# Patient Record
Sex: Female | Born: 1940
Health system: Southern US, Community
[De-identification: ages and names within clinical notes are randomized; demographics above are authoritative.]

## PROBLEM LIST (undated history)

## (undated) DIAGNOSIS — K5790 Diverticulosis of intestine, part unspecified, without perforation or abscess without bleeding: Secondary | ICD-10-CM

## (undated) DIAGNOSIS — M5030 Other cervical disc degeneration, unspecified cervical region: Secondary | ICD-10-CM

## (undated) DIAGNOSIS — M810 Age-related osteoporosis without current pathological fracture: Secondary | ICD-10-CM

## (undated) DIAGNOSIS — K635 Polyp of colon: Secondary | ICD-10-CM

## (undated) DIAGNOSIS — G4700 Insomnia, unspecified: Secondary | ICD-10-CM

## (undated) DIAGNOSIS — E785 Hyperlipidemia, unspecified: Secondary | ICD-10-CM

## (undated) DIAGNOSIS — R9430 Abnormal result of cardiovascular function study, unspecified: Secondary | ICD-10-CM

## (undated) DIAGNOSIS — J189 Pneumonia, unspecified organism: Secondary | ICD-10-CM

## (undated) HISTORY — DX: Hyperlipidemia, unspecified: E78.5

## (undated) HISTORY — DX: Abnormal result of cardiovascular function study, unspecified: R94.30

## (undated) HISTORY — PX: ABDOMINAL HYSTERECTOMY: SHX81

## (undated) HISTORY — DX: Diverticulosis of intestine, part unspecified, without perforation or abscess without bleeding: K57.90

## (undated) HISTORY — DX: Other cervical disc degeneration, unspecified cervical region: M50.30

## (undated) HISTORY — DX: Age-related osteoporosis without current pathological fracture: M81.0

## (undated) HISTORY — DX: Polyp of colon: K63.5

## (undated) HISTORY — DX: Pneumonia, unspecified organism: J18.9

## (undated) HISTORY — PX: FOOT SURGERY: SHX648

## (undated) HISTORY — DX: Insomnia, unspecified: G47.00

---

## 1969-03-24 HISTORY — PX: ABDOMINAL HYSTERECTOMY: SHX81

## 2003-08-19 ENCOUNTER — Ambulatory Visit (HOSPITAL_COMMUNITY): Admission: RE | Admit: 2003-08-19 | Discharge: 2003-08-19 | Payer: Self-pay | Admitting: Gastroenterology

## 2011-08-02 DIAGNOSIS — Z1231 Encounter for screening mammogram for malignant neoplasm of breast: Secondary | ICD-10-CM | POA: Diagnosis not present

## 2011-12-28 DIAGNOSIS — H35369 Drusen (degenerative) of macula, unspecified eye: Secondary | ICD-10-CM | POA: Diagnosis not present

## 2012-03-27 DIAGNOSIS — G479 Sleep disorder, unspecified: Secondary | ICD-10-CM | POA: Diagnosis not present

## 2012-03-27 DIAGNOSIS — E663 Overweight: Secondary | ICD-10-CM | POA: Diagnosis not present

## 2012-03-27 DIAGNOSIS — Z79899 Other long term (current) drug therapy: Secondary | ICD-10-CM | POA: Diagnosis not present

## 2012-03-27 DIAGNOSIS — Z1331 Encounter for screening for depression: Secondary | ICD-10-CM | POA: Diagnosis not present

## 2012-03-27 DIAGNOSIS — E7800 Pure hypercholesterolemia, unspecified: Secondary | ICD-10-CM | POA: Diagnosis not present

## 2012-03-27 DIAGNOSIS — Z0000 Encounter for general adult medical examination without abnormal findings: Secondary | ICD-10-CM | POA: Diagnosis not present

## 2012-03-27 DIAGNOSIS — M810 Age-related osteoporosis without current pathological fracture: Secondary | ICD-10-CM | POA: Diagnosis not present

## 2012-03-27 DIAGNOSIS — Z131 Encounter for screening for diabetes mellitus: Secondary | ICD-10-CM | POA: Diagnosis not present

## 2012-04-09 DIAGNOSIS — M810 Age-related osteoporosis without current pathological fracture: Secondary | ICD-10-CM | POA: Diagnosis not present

## 2012-04-09 DIAGNOSIS — Z8262 Family history of osteoporosis: Secondary | ICD-10-CM | POA: Diagnosis not present

## 2012-05-06 DIAGNOSIS — Z23 Encounter for immunization: Secondary | ICD-10-CM | POA: Diagnosis not present

## 2012-05-28 DIAGNOSIS — M79609 Pain in unspecified limb: Secondary | ICD-10-CM | POA: Diagnosis not present

## 2012-05-28 DIAGNOSIS — M7730 Calcaneal spur, unspecified foot: Secondary | ICD-10-CM | POA: Diagnosis not present

## 2012-05-28 DIAGNOSIS — M7750 Other enthesopathy of unspecified foot: Secondary | ICD-10-CM | POA: Diagnosis not present

## 2013-01-01 DIAGNOSIS — H5200 Hypermetropia, unspecified eye: Secondary | ICD-10-CM | POA: Diagnosis not present

## 2013-01-01 DIAGNOSIS — H52229 Regular astigmatism, unspecified eye: Secondary | ICD-10-CM | POA: Diagnosis not present

## 2013-01-01 DIAGNOSIS — H2510 Age-related nuclear cataract, unspecified eye: Secondary | ICD-10-CM | POA: Diagnosis not present

## 2013-01-01 DIAGNOSIS — H35449 Age-related reticular degeneration of retina, unspecified eye: Secondary | ICD-10-CM | POA: Diagnosis not present

## 2013-05-21 DIAGNOSIS — C44319 Basal cell carcinoma of skin of other parts of face: Secondary | ICD-10-CM | POA: Diagnosis not present

## 2013-05-21 DIAGNOSIS — L821 Other seborrheic keratosis: Secondary | ICD-10-CM | POA: Diagnosis not present

## 2013-05-21 DIAGNOSIS — D485 Neoplasm of uncertain behavior of skin: Secondary | ICD-10-CM | POA: Diagnosis not present

## 2013-06-10 DIAGNOSIS — Z0000 Encounter for general adult medical examination without abnormal findings: Secondary | ICD-10-CM | POA: Diagnosis not present

## 2013-06-10 DIAGNOSIS — E663 Overweight: Secondary | ICD-10-CM | POA: Diagnosis not present

## 2013-06-10 DIAGNOSIS — M810 Age-related osteoporosis without current pathological fracture: Secondary | ICD-10-CM | POA: Diagnosis not present

## 2013-06-10 DIAGNOSIS — G479 Sleep disorder, unspecified: Secondary | ICD-10-CM | POA: Diagnosis not present

## 2013-06-10 DIAGNOSIS — M79609 Pain in unspecified limb: Secondary | ICD-10-CM | POA: Diagnosis not present

## 2013-06-10 DIAGNOSIS — R0609 Other forms of dyspnea: Secondary | ICD-10-CM | POA: Diagnosis not present

## 2013-06-10 DIAGNOSIS — Z23 Encounter for immunization: Secondary | ICD-10-CM | POA: Diagnosis not present

## 2013-06-10 DIAGNOSIS — E7800 Pure hypercholesterolemia, unspecified: Secondary | ICD-10-CM | POA: Diagnosis not present

## 2013-06-12 ENCOUNTER — Other Ambulatory Visit: Payer: Self-pay | Admitting: Family Medicine

## 2013-06-12 DIAGNOSIS — R0609 Other forms of dyspnea: Secondary | ICD-10-CM

## 2013-06-16 ENCOUNTER — Ambulatory Visit
Admission: RE | Admit: 2013-06-16 | Discharge: 2013-06-16 | Disposition: A | Discharge status: Home / Self Care | Payer: Medicare Other | Source: Ambulatory Visit | Attending: Family Medicine | Admitting: Family Medicine

## 2013-06-16 DIAGNOSIS — R0609 Other forms of dyspnea: Secondary | ICD-10-CM

## 2013-06-16 HISTORY — PX: OTHER SURGICAL HISTORY: SHX169

## 2013-06-16 MED ORDER — IOHEXOL 350 MG/ML SOLN
125.0000 mL | Freq: Once | INTRAVENOUS | Status: AC | PRN
  Administered 2013-06-16: 125 mL via INTRAVENOUS

## 2013-06-17 DIAGNOSIS — C4401 Basal cell carcinoma of skin of lip: Secondary | ICD-10-CM | POA: Diagnosis not present

## 2013-06-20 ENCOUNTER — Ambulatory Visit (INDEPENDENT_AMBULATORY_CARE_PROVIDER_SITE_OTHER): Payer: Medicare Other | Admitting: Critical Care Medicine

## 2013-06-20 ENCOUNTER — Encounter: Payer: Self-pay | Admitting: Critical Care Medicine

## 2013-06-20 VITALS — BP 128/80 | HR 78 | Temp 98.3°F | Ht 63.5 in | Wt 164.0 lb

## 2013-06-20 DIAGNOSIS — R9389 Abnormal findings on diagnostic imaging of other specified body structures: Secondary | ICD-10-CM

## 2013-06-20 DIAGNOSIS — C4491 Basal cell carcinoma of skin, unspecified: Secondary | ICD-10-CM

## 2013-06-20 DIAGNOSIS — E785 Hyperlipidemia, unspecified: Secondary | ICD-10-CM | POA: Diagnosis not present

## 2013-06-20 DIAGNOSIS — M810 Age-related osteoporosis without current pathological fracture: Secondary | ICD-10-CM

## 2013-06-20 DIAGNOSIS — R0609 Other forms of dyspnea: Secondary | ICD-10-CM | POA: Diagnosis not present

## 2013-06-20 DIAGNOSIS — R0989 Other specified symptoms and signs involving the circulatory and respiratory systems: Secondary | ICD-10-CM

## 2013-06-20 DIAGNOSIS — R0600 Dyspnea, unspecified: Secondary | ICD-10-CM | POA: Insufficient documentation

## 2013-06-20 MED ORDER — AZITHROMYCIN 250 MG PO TABS: ORAL_TABLET | ORAL | Status: AC

## 2013-06-20 MED ORDER — AZITHROMYCIN 250 MG PO TABS: ORAL_TABLET | ORAL | Status: DC

## 2013-06-20 NOTE — Assessment & Plan Note (Signed)
Mild right middle lobe tree in bud infiltrate centered along right middle lobe airway likely representing Mycobacterium avium intracellulare infection No evidence for malignancy or significant bronchiectasis Plan Begin azithromycin 250 mg daily for 10 days Obtain full set of pulmonary function studies No indication for bronchoscopy

## 2013-06-20 NOTE — Progress Notes (Signed)
Subjective:    Patient ID: Natasha Madden, female    DOB: June 24, 1941, 72 y.o.   MRN: [id number]  HPI Comments: Pt had a physical exam one week ago and noted dyspnea for several years, unchanged. CT done of chest for PE r/o>>>neg for PE but abn in upper lobe   Shortness of Breath This is a chronic problem. The current episode started more than 1 year ago. Episode frequency: if walks incline or run fast only. The problem has been unchanged. Associated symptoms include rhinorrhea. Pertinent negatives include no abdominal pain, chest pain, claudication, coryza, ear pain, fever, headaches, hemoptysis, leg pain, leg swelling, neck pain, orthopnea, PND, rash, sore throat, sputum production, swollen glands, syncope, vomiting or wheezing. The symptoms are aggravated by exercise. Associated symptoms comments: Cough is new for past week only. Risk factors include smoking (passive smoke only ). She has tried nothing for the symptoms. There is no history of allergies, aspirin allergies, asthma, bronchiolitis, CAD, chronic lung disease, COPD, DVT, a heart failure, PE, pneumonia or a recent surgery.    Past Medical History  Diagnosis Date  . Colon polyp   . Osteoporosis   . Hyperlipidemia   . Insomnia   . Diverticulosis      Family History  Problem Relation Age of Onset  . Pneumonia Father 70  . Heart failure Mother   . Heart disease Sister   . Diabetes Sister      History   Social History  . Marital Status: Married    Spouse Name: N/A    Number of Children: N/A  . Years of Education: N/A   Occupational History  . Not on file.   Social History Main Topics  . Smoking status: Never Smoker   . Smokeless tobacco: Not on file  . Alcohol Use: Yes     Comment: social  . Drug Use: No  . Sexual Activity: Not on file   Other Topics Concern  . Not on file   Social History Narrative  . No narrative on file     No Known Allergies   No outpatient prescriptions prior to visit.   No  facility-administered medications prior to visit.      Review of Systems  Constitutional: Negative for fever and unexpected weight change.  HENT: Positive for congestion, rhinorrhea and sinus pressure. Negative for dental problem, ear pain, nosebleeds, sneezing, sore throat and trouble swallowing.   Eyes: Negative for redness and itching.  Respiratory: Positive for shortness of breath. Negative for hemoptysis, sputum production, chest tightness and wheezing.   Cardiovascular: Negative for chest pain, palpitations, orthopnea, claudication, leg swelling, syncope and PND.  Gastrointestinal: Negative for nausea, vomiting and abdominal pain.  Genitourinary: Positive for dysuria.  Musculoskeletal: Negative for joint swelling and neck pain.  Skin: Negative for rash.  Neurological: Negative for headaches.  Hematological: Does not bruise/bleed easily.  Psychiatric/Behavioral: Positive for dysphoric mood. The patient is not nervous/anxious.        Objective:   Physical Exam Filed Vitals:   06/20/13 0908  BP: 128/80  Pulse: 78  Temp: 98.3 F (36.8 C)  TempSrc: Oral  Height: 5' 3.5" (1.613 m)  Weight: 164 lb (74.39 kg)  SpO2: 97%    Gen: Pleasant, well-nourished, in no distress,  normal affect  ENT: No lesions,  mouth clear,  oropharynx clear, no postnasal drip  Neck: No JVD, no TMG, no carotid bruits  Lungs: No use of accessory muscles, no dullness to percussion, clear without rales or  rhonchi  Cardiovascular: RRR, heart sounds normal, no murmur or gallops, no peripheral edema  Abdomen: soft and NT, no HSM,  BS normal  Musculoskeletal: No deformities, no cyanosis or clubbing  Neuro: alert, non focal  Skin: Warm, no lesions or rashes  No results found. Recent CT scan of the chest was reviewed and shows right middle lobe tree in bud infiltrate and distal airway compatible with MAI       Assessment & Plan:   Abnormal CT of the chest Mild right middle lobe tree in bud  infiltrate centered along right middle lobe airway likely representing Mycobacterium avium intracellulare infection No evidence for malignancy or significant bronchiectasis Plan Begin azithromycin 250 mg daily for 10 days Obtain full set of pulmonary function studies No indication for bronchoscopy  Dyspnea Dyspnea on the basis of upper airway inflammation Plan Obtain pulmonary function studies   Updated Medication List Outpatient Encounter Prescriptions as of 06/20/2013  Medication Sig  . alendronate (FOSAMAX) 70 MG tablet Take 70 mg by mouth once a week. Take with a full glass of water on an empty stomach.  Marland Kitchen azithromycin (ZITHROMAX) 250 MG tablet Take one daily  . simvastatin (ZOCOR) 40 MG tablet Take 40 mg by mouth daily.  . [DISCONTINUED] azithromycin (ZITHROMAX) 250 MG tablet Take 250 mg by mouth daily.  . [DISCONTINUED] azithromycin (ZITHROMAX) 250 MG tablet Take one daily  . [DISCONTINUED] azithromycin (ZITHROMAX) 250 MG tablet Take one daily  . [DISCONTINUED] azithromycin (ZITHROMAX) 250 MG tablet Take one daily

## 2013-06-20 NOTE — Patient Instructions (Signed)
Take azithromycin for 10 days total.  4 more sent to pharmacy A pulmonary function study will be obtained along with a 6 min walk Return 1 month

## 2013-06-20 NOTE — Assessment & Plan Note (Signed)
Dyspnea on the basis of upper airway inflammation Plan Obtain pulmonary function studies

## 2013-07-25 ENCOUNTER — Encounter: Payer: Self-pay | Admitting: Internal Medicine

## 2013-07-25 ENCOUNTER — Encounter (INDEPENDENT_AMBULATORY_CARE_PROVIDER_SITE_OTHER): Payer: Self-pay

## 2013-07-25 ENCOUNTER — Ambulatory Visit (INDEPENDENT_AMBULATORY_CARE_PROVIDER_SITE_OTHER): Payer: Medicare Other | Admitting: Internal Medicine

## 2013-07-25 VITALS — BP 120/80 | HR 94 | Temp 98.1°F | Ht 63.5 in | Wt 165.2 lb

## 2013-07-25 DIAGNOSIS — R0989 Other specified symptoms and signs involving the circulatory and respiratory systems: Secondary | ICD-10-CM

## 2013-07-25 DIAGNOSIS — R0609 Other forms of dyspnea: Secondary | ICD-10-CM

## 2013-07-25 DIAGNOSIS — R0600 Dyspnea, unspecified: Secondary | ICD-10-CM

## 2013-07-25 DIAGNOSIS — R9389 Abnormal findings on diagnostic imaging of other specified body structures: Secondary | ICD-10-CM

## 2013-07-25 LAB — PULMONARY FUNCTION TEST
DL/VA % pred: 96 %
DL/VA: 4.56 ml/min/mmHg/L
DLCO unc % pred: 78 %
DLCO unc: 18.51 ml/min/mmHg
FEF 25-75 POST: 1.47 L/s
FEF 25-75 Pre: 1.12 L/sec
FEF2575-%Change-Post: 31 %
FEF2575-%PRED-POST: 87 %
FEF2575-%PRED-PRE: 66 %
FEV1-%Change-Post: 7 %
FEV1-%PRED-POST: 90 %
FEV1-%PRED-PRE: 83 %
FEV1-Post: 1.89 L
FEV1-Pre: 1.75 L
FEV1FVC-%Change-Post: 6 %
FEV1FVC-%Pred-Pre: 93 %
FEV6-%CHANGE-POST: 2 %
FEV6-%Pred-Post: 94 %
FEV6-%Pred-Pre: 91 %
FEV6-Post: 2.51 L
FEV6-Pre: 2.44 L
FEV6FVC-%Change-Post: 1 %
FEV6FVC-%Pred-Post: 104 %
FEV6FVC-%Pred-Pre: 102 %
FVC-%Change-Post: 1 %
FVC-%PRED-POST: 91 %
FVC-%Pred-Pre: 89 %
FVC-Post: 2.54 L
FVC-Pre: 2.5 L
PRE FEV1/FVC RATIO: 70 %
Post FEV1/FVC ratio: 74 %
Post FEV6/FVC ratio: 99 %
Pre FEV6/FVC Ratio: 98 %
RV % pred: 90 %
RV: 2.02 L
TLC % pred: 91 %
TLC: 4.55 L

## 2013-07-25 NOTE — Patient Instructions (Signed)
Best option for cough is mucinex dm up to 1200 mg every 12 hours as needed  If not responding to mucinex dm we need to see you away:  Ask Dr Joya Gaskins / Tammy NP/ Dr Melvyn Novas

## 2013-07-25 NOTE — Progress Notes (Signed)
Subjective:    Patient ID: Natasha Madden, female    DOB: 1940/12/04   MRN: [id number]  HPI Comments:  06/20/13  Eval/ Dr Joya Gaskins Pt had a physical exam one week prior to OV   and noted dyspnea for several years, unchanged. CT done of chest for PE r/o>>>neg for PE but abn in upper lobe   Shortness of Breath This is a chronic problem. The current episode started more than 1 year ago. Episode frequency: if walks incline or run fast only. The problem has been unchanged. Associated symptoms include rhinorrhea. Pertinent negatives include no abdominal pain, chest pain, claudication, coryza, ear pain, fever, headaches, hemoptysis, leg pain, leg swelling, neck pain, orthopnea, PND, rash, sore throat, sputum production, swollen glands, syncope, vomiting or wheezing. The symptoms are aggravated by exercise. Associated symptoms comments: Cough is new for past week only. Risk factors include smoking (passive smoke only ). She has tried nothing for the symptoms. There is no history of allergies, aspirin allergies, asthma, bronchiolitis, CAD, chronic lung disease, COPD, DVT, a heart failure, PE, pneumonia or a recent surgery. rec  Take azithromycin for 10 days total.  4 more sent to pharmacy A pulmonary function study will be obtained along with a 6 min walk  07/25/2013 f/u ov/Natasha Madden re:  ? MAI Chief Complaint  Patient presents with  . Follow-up    PFT and 6 MINUTE WALK done today.  Discuss results   pt improved, denies limiting sob but not aerobically active, some am cough/ congestion but no discolored mucus.  No obvious day to day or daytime variabilty or assoc   cp or chest tightness, subjective wheeze overt sinus or hb symptoms. No unusual exp hx or h/o childhood pna/ asthma or knowledge of premature birth.  Sleeping ok without nocturnal  or early am exacerbation  of respiratory  c/o's or need for noct saba. Also denies any obvious fluctuation of symptoms with weather or environmental changes or other  aggravating or alleviating factors except as outlined above   Current Medications, Allergies, Complete Past Medical History, Past Surgical History, Family History, and Social History were reviewed in [employer] record.  ROS  The following are not active complaints unless bolded sore throat, dysphagia, dental problems, itching, sneezing,  nasal congestion or excess/ purulent secretions, ear ache,   fever, chills, sweats, unintended wt loss, pleuritic or exertional cp, hemoptysis,  orthopnea pnd or leg swelling, presyncope, palpitations, heartburn, abdominal pain, anorexia, nausea, vomiting, diarrhea  or change in bowel or urinary habits, change in stools or urine, dysuria,hematuria,  rash, arthralgias, visual complaints, headache, numbness weakness or ataxia or problems with walking or coordination,  change in mood/affect or memory.             Objective:   Physical Exam  Wt Readings from Last 3 Encounters:  07/25/13 165 lb 3.2 oz (74.934 kg)  06/20/13 164 lb (74.39 kg)        Gen: Pleasant, well-nourished, in no distress,  normal affect  ENT: No lesions,  mouth clear,  oropharynx clear, no postnasal drip  Neck: No JVD, no TMG, no carotid bruits  Lungs: No use of accessory muscles, no dullness to percussion, clear without rales or rhonchi  Cardiovascular: RRR, heart sounds normal, no murmur or gallops, no peripheral edema  Abdomen: soft and NT, no HSM,  BS normal  Musculoskeletal: No deformities, no cyanosis or clubbing  Neuro: alert, non focal  Skin: Warm, no lesions or rashes  06/16/13 CT scan of the chest was reviewed and shows right middle lobe tree in bud infiltrate and distal airway compatible with MAI       Assessment & Plan:

## 2013-07-25 NOTE — Progress Notes (Signed)
PFT done today. 

## 2013-07-27 NOTE — Assessment & Plan Note (Addendum)
See 6 mw test 07/25/13 :  489 m   With no desats or difficulty - PFT's 07/25/13 FEV1  1.75 (83%) ratio 70 and nl DLCO 78% DLC0 78%  ERV 65%  Does not appear to be limited by breathing at this point and only significant abnormality is ERV which is related to body habitus and not typically a pulmonary problem  > pulmonary f/u is prn

## 2013-07-27 NOTE — Assessment & Plan Note (Addendum)
Although there are clearly abnormalities on CT scan, they should probably be considered "microscopic" since not obvious on plain cxr .     she very well could have low grade MAI but no assoc bronchiectasis and improved after only short course of zmax which is good choice for any flares of bronchitis anyway.   For now rec mucinex and f/u prn

## 2013-07-28 ENCOUNTER — Encounter: Payer: Self-pay | Admitting: Internal Medicine

## 2013-07-29 NOTE — Progress Notes (Signed)
Quick Note:  Called, spoke with pt. Informed her of results per Dr. Joya Gaskins. She verbalized understanding of this. MW had also reviewed results with her during OV after PFT. ______

## 2013-08-05 DIAGNOSIS — Z1231 Encounter for screening mammogram for malignant neoplasm of breast: Secondary | ICD-10-CM | POA: Diagnosis not present

## 2013-12-09 DIAGNOSIS — Z23 Encounter for immunization: Secondary | ICD-10-CM | POA: Diagnosis not present

## 2013-12-09 DIAGNOSIS — G479 Sleep disorder, unspecified: Secondary | ICD-10-CM | POA: Diagnosis not present

## 2013-12-09 DIAGNOSIS — M810 Age-related osteoporosis without current pathological fracture: Secondary | ICD-10-CM | POA: Diagnosis not present

## 2013-12-09 DIAGNOSIS — R0609 Other forms of dyspnea: Secondary | ICD-10-CM | POA: Diagnosis not present

## 2013-12-09 DIAGNOSIS — E7800 Pure hypercholesterolemia, unspecified: Secondary | ICD-10-CM | POA: Diagnosis not present

## 2013-12-09 DIAGNOSIS — R0989 Other specified symptoms and signs involving the circulatory and respiratory systems: Secondary | ICD-10-CM | POA: Diagnosis not present

## 2014-01-22 ENCOUNTER — Encounter: Payer: Self-pay | Admitting: Cardiology

## 2014-01-22 DIAGNOSIS — E785 Hyperlipidemia, unspecified: Secondary | ICD-10-CM | POA: Insufficient documentation

## 2014-01-26 ENCOUNTER — Encounter: Payer: Self-pay | Admitting: Cardiology

## 2014-01-26 ENCOUNTER — Ambulatory Visit (INDEPENDENT_AMBULATORY_CARE_PROVIDER_SITE_OTHER): Payer: Medicare Other | Admitting: Cardiology

## 2014-01-26 ENCOUNTER — Other Ambulatory Visit: Payer: Self-pay | Admitting: *Deleted

## 2014-01-26 VITALS — BP 131/78 | HR 87 | Ht 63.0 in | Wt 163.0 lb

## 2014-01-26 DIAGNOSIS — R0600 Dyspnea, unspecified: Secondary | ICD-10-CM

## 2014-01-26 DIAGNOSIS — R0989 Other specified symptoms and signs involving the circulatory and respiratory systems: Secondary | ICD-10-CM | POA: Diagnosis not present

## 2014-01-26 DIAGNOSIS — E785 Hyperlipidemia, unspecified: Secondary | ICD-10-CM

## 2014-01-26 DIAGNOSIS — R0609 Other forms of dyspnea: Secondary | ICD-10-CM | POA: Diagnosis not present

## 2014-01-26 DIAGNOSIS — Z136 Encounter for screening for cardiovascular disorders: Secondary | ICD-10-CM | POA: Diagnosis not present

## 2014-01-26 NOTE — Patient Instructions (Signed)
Your physician recommends that you schedule a follow-up appointment in: 3 months. You will receive a reminder letter in the mail in about 1-2 months reminding you to call and schedule your appointment. If you don't receive this letter, please contact our office. Your physician recommends that you continue on your current medications as directed. Please refer to the Current Medication list given to you today. Your physician has requested that you have an echocardiogram. Echocardiography is a painless test that uses sound waves to create images of your heart. It provides your doctor with information about the size and shape of your heart and how well your heart's chambers and valves are working. This procedure takes approximately one hour. There are no restrictions for this procedure.

## 2014-01-26 NOTE — Assessment & Plan Note (Signed)
Her lipids are being treated. No change in therapy. 

## 2014-01-26 NOTE — Assessment & Plan Note (Signed)
The major complaint is that for a long period of time she has noted that she gets shortness of breath when walking up a hill. She does not sense this when walking on flat areas. She think she may have felt this way for many years. Her lung evaluation has shown no marked pulmonary disease. Her EKG shows nonspecific ST-T wave changes. I'm not convinced that this represents ischemia at this time. Therefore I've chosen not to proceed with any exercise testing. However it is important to rule out underlying left ventricular dysfunction. Two-dimensional echo will be done. We will be in touch with her with the results. Will also plan to see her back in one followup visit in approximately 3 months to reassess her symptoms.

## 2014-01-26 NOTE — Progress Notes (Signed)
Patient ID: Natasha Madden, female   DOB: [birth year], 73 y.o.   MRN: [id number]    HPI   The patient is seen today in consultation for the evaluation of exertional shortness of breath. She actually does quite well. Her symptom comes on only when she is walking uphill. She is actually had this for at least one year. She's had a full valuation by pulmonary. This has included a 6 minute walk and pulmonary function studies. It is felt that she does not have any significant pulmonary component causing significant shortness of breath. She denies any chest pain. I take care of her husband Mr Elania Crowl.  As part of today's evaluation I have carefully reviewed the primary care note sent to me by Dr. Kathyrn Lass. I have also reviewed the pulmonary notes in the electronic medical record. I have reviewed labs that were sent.   No Known Allergies  Current Outpatient Prescriptions  Medication Sig Dispense Refill  . Multiple Vitamin (MULTIVITAMIN) tablet Take 1 tablet by mouth daily.      . simvastatin (ZOCOR) 40 MG tablet Take 40 mg by mouth daily.       No current facility-administered medications for this visit.    History   Social History  . Marital Status: Married    Spouse Name: N/A    Number of Children: N/A  . Years of Education: N/A   Occupational History  . Not on file.   Social History Main Topics  . Smoking status: Never Smoker   . Smokeless tobacco: Never Used  . Alcohol Use: Yes     Comment: social  . Drug Use: No  . Sexual Activity: Not on file   Other Topics Concern  . Not on file   Social History Narrative  . No narrative on file    Family History  Problem Relation Age of Onset  . Pneumonia Father 62  . Heart failure Mother   . Heart disease Sister   . Diabetes Sister     Past Medical History  Diagnosis Date  . Colon polyp   . Osteoporosis   . Hyperlipidemia   . Insomnia   . Diverticulosis     Past Surgical History  Procedure Laterality Date  . Skin  cancer removal  06/16/13    lower lip  . Abdominal hysterectomy  1970's    Patient Active Problem List   Diagnosis Date Noted  . Hyperlipidemia   . Osteoporosis, unspecified 06/20/2013  . Dyspnea 06/20/2013  . Abnormal CT of the chest 06/20/2013  . Basal cell carcinoma 06/20/2013    ROS   Patient denies fever, chills, headache, sweats, rash, change in vision, change in hearing, chest pain, cough, nausea or vomiting, urinary symptoms. All other systems are reviewed and are negative.  PHYSICAL EXAM   Patient is oriented to person time and place. Affect is normal. Head is atraumatic. Sclera and conjunctiva are normal. There is no jugulovenous distention. Lungs are clear. Respiratory effort is nonlabored. She is mildly overweight. Cardiac exam her vitals S1 and S2. There no clicks or significant murmurs. The abdomen is soft. There is no peripheral edema. There are no musculoskeletal deformities. There are no skin rashes.  Filed Vitals:   01/26/14 1320  BP: 131/78  Pulse: 87  Height: 5\' 3"  (1.6 m)  Weight: 163 lb (73.936 kg)   EKG is done today and reviewed by me. She has normal sinus rhythm. There are nonspecific ST-T wave abnormalities.  ASSESSMENT & PLAN

## 2014-01-28 ENCOUNTER — Other Ambulatory Visit: Payer: Self-pay

## 2014-01-28 ENCOUNTER — Other Ambulatory Visit (INDEPENDENT_AMBULATORY_CARE_PROVIDER_SITE_OTHER): Payer: Medicare Other

## 2014-01-28 DIAGNOSIS — I379 Nonrheumatic pulmonary valve disorder, unspecified: Secondary | ICD-10-CM

## 2014-01-28 DIAGNOSIS — I369 Nonrheumatic tricuspid valve disorder, unspecified: Secondary | ICD-10-CM

## 2014-01-28 DIAGNOSIS — R0602 Shortness of breath: Secondary | ICD-10-CM

## 2014-01-28 DIAGNOSIS — R0600 Dyspnea, unspecified: Secondary | ICD-10-CM

## 2014-01-28 DIAGNOSIS — I059 Rheumatic mitral valve disease, unspecified: Secondary | ICD-10-CM

## 2014-01-30 ENCOUNTER — Encounter: Payer: Self-pay | Admitting: Cardiology

## 2014-01-30 DIAGNOSIS — I340 Nonrheumatic mitral (valve) insufficiency: Secondary | ICD-10-CM | POA: Insufficient documentation

## 2014-01-30 DIAGNOSIS — R9430 Abnormal result of cardiovascular function study, unspecified: Secondary | ICD-10-CM | POA: Insufficient documentation

## 2014-02-03 ENCOUNTER — Telehealth: Payer: Self-pay | Admitting: *Deleted

## 2014-02-03 NOTE — Telephone Encounter (Signed)
Patient informed. 

## 2014-05-11 DIAGNOSIS — H35363 Drusen (degenerative) of macula, bilateral: Secondary | ICD-10-CM | POA: Diagnosis not present

## 2014-05-13 DIAGNOSIS — J40 Bronchitis, not specified as acute or chronic: Secondary | ICD-10-CM | POA: Diagnosis not present

## 2014-05-13 DIAGNOSIS — J069 Acute upper respiratory infection, unspecified: Secondary | ICD-10-CM | POA: Diagnosis not present

## 2014-05-28 DIAGNOSIS — J181 Lobar pneumonia, unspecified organism: Secondary | ICD-10-CM | POA: Diagnosis not present

## 2014-05-28 DIAGNOSIS — J069 Acute upper respiratory infection, unspecified: Secondary | ICD-10-CM | POA: Diagnosis not present

## 2014-05-28 DIAGNOSIS — J40 Bronchitis, not specified as acute or chronic: Secondary | ICD-10-CM | POA: Diagnosis not present

## 2014-06-11 DIAGNOSIS — M810 Age-related osteoporosis without current pathological fracture: Secondary | ICD-10-CM | POA: Diagnosis not present

## 2014-06-11 DIAGNOSIS — Z79899 Other long term (current) drug therapy: Secondary | ICD-10-CM | POA: Diagnosis not present

## 2014-06-11 DIAGNOSIS — E663 Overweight: Secondary | ICD-10-CM | POA: Diagnosis not present

## 2014-06-11 DIAGNOSIS — G479 Sleep disorder, unspecified: Secondary | ICD-10-CM | POA: Diagnosis not present

## 2014-06-11 DIAGNOSIS — E780 Pure hypercholesterolemia: Secondary | ICD-10-CM | POA: Diagnosis not present

## 2014-07-29 ENCOUNTER — Ambulatory Visit (INDEPENDENT_AMBULATORY_CARE_PROVIDER_SITE_OTHER): Payer: Medicare Other | Admitting: Cardiology

## 2014-07-29 ENCOUNTER — Encounter: Payer: Self-pay | Admitting: Cardiology

## 2014-07-29 VITALS — BP 122/78 | HR 73 | Ht 63.0 in | Wt 162.0 lb

## 2014-07-29 DIAGNOSIS — R0600 Dyspnea, unspecified: Secondary | ICD-10-CM | POA: Diagnosis not present

## 2014-07-29 DIAGNOSIS — I340 Nonrheumatic mitral (valve) insufficiency: Secondary | ICD-10-CM | POA: Diagnosis not present

## 2014-07-29 NOTE — Assessment & Plan Note (Signed)
She has mild mitral regurgitation by echo. Consideration could be given to a follow-up echo in 3-5 years.

## 2014-07-29 NOTE — Assessment & Plan Note (Signed)
She has good left ventricular function. She does have pulmonary disease. It appears that her shortness of breath symptoms are most related to her pulmonary disease. No further cardiac workup is needed.

## 2014-07-29 NOTE — Progress Notes (Signed)
Patient ID: Natasha Madden, female   DOB: 12/22/1940, 74 y.o.   MRN: [id number]    HPI Patient is seen to follow-up some shortness of breath. I saw her in consultation in July, 2015. She has lung disease. We did decide to proceed with a 2-D echo to assess LV function and valvular function. She has a normal ejection fraction of 60%. There is mild mitral regurgitation. Since I evaluated her in July, she has had follow-up pulmonary evaluation and a chest CT. She has some chronic changes that are being followed. It appears that her shortness of breath is a pulmonary issue.  No Known Allergies  Current Outpatient Prescriptions  Medication Sig Dispense Refill  . Multiple Vitamin (MULTIVITAMIN) tablet Take 1 tablet by mouth daily.    . simvastatin (ZOCOR) 40 MG tablet Take 40 mg by mouth daily.     No current facility-administered medications for this visit.    History   Social History  . Marital Status: Married    Spouse Name: N/A    Number of Children: N/A  . Years of Education: N/A   Occupational History  . Not on file.   Social History Main Topics  . Smoking status: Never Smoker   . Smokeless tobacco: Never Used  . Alcohol Use: Yes     Comment: social  . Drug Use: No  . Sexual Activity: Not on file   Other Topics Concern  . Not on file   Social History Narrative    Family History  Problem Relation Age of Onset  . Pneumonia Father 10  . Heart failure Mother   . Heart disease Sister   . Diabetes Sister     Past Medical History  Diagnosis Date  . Colon polyp   . Osteoporosis   . Hyperlipidemia   . Insomnia   . Diverticulosis   . Ejection fraction     Past Surgical History  Procedure Laterality Date  . Skin cancer removal  06/16/13    lower lip  . Abdominal hysterectomy  1970's    Patient Active Problem List   Diagnosis Date Noted  . Mitral regurgitation 01/30/2014  . Ejection fraction   . Hyperlipidemia   . Osteoporosis, unspecified 06/20/2013  . Dyspnea  06/20/2013  . Abnormal CT of the chest 06/20/2013  . Basal cell carcinoma 06/20/2013    ROS  Patient denies fever, chills, headache, sweats, rash, change in vision, change in hearing, chest pain, cough, nausea or vomiting, urinary symptoms. All other systems are reviewed and are negative.  PHYSICAL EXAM Patient is stable. She is oriented to person time and place. Affect is normal. Head is atraumatic. Sclera and conjunctiva are normal. There is no jugular venous distention. Lungs are clear. Respiratory effort is not labored. Cardiac exam reveals S1 and S2. The abdomen is soft. There is no peripheral edema.  Filed Vitals:   07/29/14 1041  BP: 122/78  Pulse: 73  Height: 5\' 3"  (1.6 m)  Weight: 162 lb (73.483 kg)     ASSESSMENT & PLAN

## 2014-07-29 NOTE — Patient Instructions (Signed)
   No cardiology follow up needed at this time. Please contact our office as needed. Your physician recommends that you continue on your current medications as directed. Please refer to the Current Medication list given to you today.

## 2014-08-27 DIAGNOSIS — Z09 Encounter for follow-up examination after completed treatment for conditions other than malignant neoplasm: Secondary | ICD-10-CM | POA: Diagnosis not present

## 2014-08-27 DIAGNOSIS — K5730 Diverticulosis of large intestine without perforation or abscess without bleeding: Secondary | ICD-10-CM | POA: Diagnosis not present

## 2014-08-27 DIAGNOSIS — Z86010 Personal history of colonic polyps: Secondary | ICD-10-CM | POA: Diagnosis not present

## 2014-08-31 DIAGNOSIS — Z1231 Encounter for screening mammogram for malignant neoplasm of breast: Secondary | ICD-10-CM | POA: Diagnosis not present

## 2015-02-03 DIAGNOSIS — M542 Cervicalgia: Secondary | ICD-10-CM | POA: Diagnosis not present

## 2015-04-01 ENCOUNTER — Other Ambulatory Visit: Payer: Self-pay | Admitting: Family Medicine

## 2015-04-01 ENCOUNTER — Ambulatory Visit
Admission: RE | Admit: 2015-04-01 | Discharge: 2015-04-01 | Disposition: A | Discharge status: Home / Self Care | Payer: Medicare Other | Source: Ambulatory Visit | Attending: Family Medicine | Admitting: Family Medicine

## 2015-04-01 DIAGNOSIS — M542 Cervicalgia: Secondary | ICD-10-CM | POA: Diagnosis not present

## 2015-04-01 DIAGNOSIS — M47812 Spondylosis without myelopathy or radiculopathy, cervical region: Secondary | ICD-10-CM | POA: Diagnosis not present

## 2015-04-16 DIAGNOSIS — M47811 Spondylosis without myelopathy or radiculopathy, occipito-atlanto-axial region: Secondary | ICD-10-CM | POA: Diagnosis not present

## 2015-04-16 DIAGNOSIS — M47812 Spondylosis without myelopathy or radiculopathy, cervical region: Secondary | ICD-10-CM | POA: Diagnosis not present

## 2015-04-16 DIAGNOSIS — M5032 Other cervical disc degeneration, mid-cervical region: Secondary | ICD-10-CM | POA: Diagnosis not present

## 2015-05-11 DIAGNOSIS — M5090 Cervical disc disorder, unspecified, unspecified cervical region: Secondary | ICD-10-CM | POA: Diagnosis not present

## 2015-05-11 DIAGNOSIS — M50822 Other cervical disc disorders at C5-C6 level: Secondary | ICD-10-CM | POA: Diagnosis not present

## 2015-05-19 DIAGNOSIS — M50822 Other cervical disc disorders at C5-C6 level: Secondary | ICD-10-CM | POA: Diagnosis not present

## 2015-05-19 DIAGNOSIS — M542 Cervicalgia: Secondary | ICD-10-CM | POA: Diagnosis not present

## 2015-05-19 DIAGNOSIS — M5090 Cervical disc disorder, unspecified, unspecified cervical region: Secondary | ICD-10-CM | POA: Diagnosis not present

## 2015-05-19 DIAGNOSIS — R293 Abnormal posture: Secondary | ICD-10-CM | POA: Diagnosis not present

## 2015-05-20 DIAGNOSIS — Z23 Encounter for immunization: Secondary | ICD-10-CM | POA: Diagnosis not present

## 2015-05-21 DIAGNOSIS — M5090 Cervical disc disorder, unspecified, unspecified cervical region: Secondary | ICD-10-CM | POA: Diagnosis not present

## 2015-05-21 DIAGNOSIS — R293 Abnormal posture: Secondary | ICD-10-CM | POA: Diagnosis not present

## 2015-05-21 DIAGNOSIS — M542 Cervicalgia: Secondary | ICD-10-CM | POA: Diagnosis not present

## 2015-05-21 DIAGNOSIS — M50822 Other cervical disc disorders at C5-C6 level: Secondary | ICD-10-CM | POA: Diagnosis not present

## 2015-05-25 DIAGNOSIS — M542 Cervicalgia: Secondary | ICD-10-CM | POA: Diagnosis not present

## 2015-05-25 DIAGNOSIS — M50822 Other cervical disc disorders at C5-C6 level: Secondary | ICD-10-CM | POA: Diagnosis not present

## 2015-05-25 DIAGNOSIS — R293 Abnormal posture: Secondary | ICD-10-CM | POA: Diagnosis not present

## 2015-05-25 DIAGNOSIS — M5090 Cervical disc disorder, unspecified, unspecified cervical region: Secondary | ICD-10-CM | POA: Diagnosis not present

## 2015-05-27 DIAGNOSIS — M5090 Cervical disc disorder, unspecified, unspecified cervical region: Secondary | ICD-10-CM | POA: Diagnosis not present

## 2015-05-27 DIAGNOSIS — M50822 Other cervical disc disorders at C5-C6 level: Secondary | ICD-10-CM | POA: Diagnosis not present

## 2015-05-27 DIAGNOSIS — M542 Cervicalgia: Secondary | ICD-10-CM | POA: Diagnosis not present

## 2015-05-27 DIAGNOSIS — R293 Abnormal posture: Secondary | ICD-10-CM | POA: Diagnosis not present

## 2015-05-28 DIAGNOSIS — H353121 Nonexudative age-related macular degeneration, left eye, early dry stage: Secondary | ICD-10-CM | POA: Diagnosis not present

## 2015-05-31 DIAGNOSIS — M5090 Cervical disc disorder, unspecified, unspecified cervical region: Secondary | ICD-10-CM | POA: Diagnosis not present

## 2015-05-31 DIAGNOSIS — M542 Cervicalgia: Secondary | ICD-10-CM | POA: Diagnosis not present

## 2015-05-31 DIAGNOSIS — M50822 Other cervical disc disorders at C5-C6 level: Secondary | ICD-10-CM | POA: Diagnosis not present

## 2015-05-31 DIAGNOSIS — R293 Abnormal posture: Secondary | ICD-10-CM | POA: Diagnosis not present

## 2015-06-01 DIAGNOSIS — M542 Cervicalgia: Secondary | ICD-10-CM | POA: Diagnosis not present

## 2015-06-01 DIAGNOSIS — M50822 Other cervical disc disorders at C5-C6 level: Secondary | ICD-10-CM | POA: Diagnosis not present

## 2015-06-01 DIAGNOSIS — R293 Abnormal posture: Secondary | ICD-10-CM | POA: Diagnosis not present

## 2015-06-01 DIAGNOSIS — M5090 Cervical disc disorder, unspecified, unspecified cervical region: Secondary | ICD-10-CM | POA: Diagnosis not present

## 2015-06-08 DIAGNOSIS — R293 Abnormal posture: Secondary | ICD-10-CM | POA: Diagnosis not present

## 2015-06-08 DIAGNOSIS — M50822 Other cervical disc disorders at C5-C6 level: Secondary | ICD-10-CM | POA: Diagnosis not present

## 2015-06-08 DIAGNOSIS — M5090 Cervical disc disorder, unspecified, unspecified cervical region: Secondary | ICD-10-CM | POA: Diagnosis not present

## 2015-06-08 DIAGNOSIS — M542 Cervicalgia: Secondary | ICD-10-CM | POA: Diagnosis not present

## 2015-06-10 DIAGNOSIS — M5090 Cervical disc disorder, unspecified, unspecified cervical region: Secondary | ICD-10-CM | POA: Diagnosis not present

## 2015-06-10 DIAGNOSIS — M50322 Other cervical disc degeneration at C5-C6 level: Secondary | ICD-10-CM | POA: Diagnosis not present

## 2015-06-10 DIAGNOSIS — M542 Cervicalgia: Secondary | ICD-10-CM | POA: Diagnosis not present

## 2015-06-10 DIAGNOSIS — M50822 Other cervical disc disorders at C5-C6 level: Secondary | ICD-10-CM | POA: Diagnosis not present

## 2015-06-10 DIAGNOSIS — R293 Abnormal posture: Secondary | ICD-10-CM | POA: Diagnosis not present

## 2015-06-13 DIAGNOSIS — R293 Abnormal posture: Secondary | ICD-10-CM | POA: Diagnosis not present

## 2015-06-13 DIAGNOSIS — M5090 Cervical disc disorder, unspecified, unspecified cervical region: Secondary | ICD-10-CM | POA: Diagnosis not present

## 2015-06-13 DIAGNOSIS — M542 Cervicalgia: Secondary | ICD-10-CM | POA: Diagnosis not present

## 2015-06-13 DIAGNOSIS — M50822 Other cervical disc disorders at C5-C6 level: Secondary | ICD-10-CM | POA: Diagnosis not present

## 2015-06-15 DIAGNOSIS — R293 Abnormal posture: Secondary | ICD-10-CM | POA: Diagnosis not present

## 2015-06-15 DIAGNOSIS — M50822 Other cervical disc disorders at C5-C6 level: Secondary | ICD-10-CM | POA: Diagnosis not present

## 2015-06-15 DIAGNOSIS — M542 Cervicalgia: Secondary | ICD-10-CM | POA: Diagnosis not present

## 2015-06-15 DIAGNOSIS — M5090 Cervical disc disorder, unspecified, unspecified cervical region: Secondary | ICD-10-CM | POA: Diagnosis not present

## 2015-06-21 DIAGNOSIS — M5090 Cervical disc disorder, unspecified, unspecified cervical region: Secondary | ICD-10-CM | POA: Diagnosis not present

## 2015-06-21 DIAGNOSIS — M542 Cervicalgia: Secondary | ICD-10-CM | POA: Diagnosis not present

## 2015-06-21 DIAGNOSIS — M50822 Other cervical disc disorders at C5-C6 level: Secondary | ICD-10-CM | POA: Diagnosis not present

## 2015-06-21 DIAGNOSIS — R293 Abnormal posture: Secondary | ICD-10-CM | POA: Diagnosis not present

## 2015-06-23 DIAGNOSIS — M50822 Other cervical disc disorders at C5-C6 level: Secondary | ICD-10-CM | POA: Diagnosis not present

## 2015-06-23 DIAGNOSIS — M542 Cervicalgia: Secondary | ICD-10-CM | POA: Diagnosis not present

## 2015-06-23 DIAGNOSIS — M5090 Cervical disc disorder, unspecified, unspecified cervical region: Secondary | ICD-10-CM | POA: Diagnosis not present

## 2015-06-23 DIAGNOSIS — R293 Abnormal posture: Secondary | ICD-10-CM | POA: Diagnosis not present

## 2015-06-28 DIAGNOSIS — M542 Cervicalgia: Secondary | ICD-10-CM | POA: Diagnosis not present

## 2015-06-28 DIAGNOSIS — M5090 Cervical disc disorder, unspecified, unspecified cervical region: Secondary | ICD-10-CM | POA: Diagnosis not present

## 2015-06-28 DIAGNOSIS — M50822 Other cervical disc disorders at C5-C6 level: Secondary | ICD-10-CM | POA: Diagnosis not present

## 2015-06-28 DIAGNOSIS — R293 Abnormal posture: Secondary | ICD-10-CM | POA: Diagnosis not present

## 2015-06-30 DIAGNOSIS — R293 Abnormal posture: Secondary | ICD-10-CM | POA: Diagnosis not present

## 2015-06-30 DIAGNOSIS — M5090 Cervical disc disorder, unspecified, unspecified cervical region: Secondary | ICD-10-CM | POA: Diagnosis not present

## 2015-06-30 DIAGNOSIS — M50822 Other cervical disc disorders at C5-C6 level: Secondary | ICD-10-CM | POA: Diagnosis not present

## 2015-06-30 DIAGNOSIS — M542 Cervicalgia: Secondary | ICD-10-CM | POA: Diagnosis not present

## 2015-09-02 DIAGNOSIS — Z1231 Encounter for screening mammogram for malignant neoplasm of breast: Secondary | ICD-10-CM | POA: Diagnosis not present

## 2015-09-20 DIAGNOSIS — G479 Sleep disorder, unspecified: Secondary | ICD-10-CM | POA: Diagnosis not present

## 2015-09-20 DIAGNOSIS — M5090 Cervical disc disorder, unspecified, unspecified cervical region: Secondary | ICD-10-CM | POA: Diagnosis not present

## 2015-09-20 DIAGNOSIS — Z79899 Other long term (current) drug therapy: Secondary | ICD-10-CM | POA: Diagnosis not present

## 2015-09-20 DIAGNOSIS — M810 Age-related osteoporosis without current pathological fracture: Secondary | ICD-10-CM | POA: Diagnosis not present

## 2015-09-20 DIAGNOSIS — E7800 Pure hypercholesterolemia, unspecified: Secondary | ICD-10-CM | POA: Diagnosis not present

## 2016-07-11 DIAGNOSIS — H353131 Nonexudative age-related macular degeneration, bilateral, early dry stage: Secondary | ICD-10-CM | POA: Diagnosis not present

## 2016-07-28 DIAGNOSIS — Z23 Encounter for immunization: Secondary | ICD-10-CM | POA: Diagnosis not present

## 2016-07-28 DIAGNOSIS — Z6829 Body mass index (BMI) 29.0-29.9, adult: Secondary | ICD-10-CM | POA: Diagnosis not present

## 2016-07-28 DIAGNOSIS — M7712 Lateral epicondylitis, left elbow: Secondary | ICD-10-CM | POA: Diagnosis not present

## 2016-07-28 DIAGNOSIS — E7800 Pure hypercholesterolemia, unspecified: Secondary | ICD-10-CM | POA: Diagnosis not present

## 2016-07-28 DIAGNOSIS — E663 Overweight: Secondary | ICD-10-CM | POA: Diagnosis not present

## 2016-07-28 DIAGNOSIS — G479 Sleep disorder, unspecified: Secondary | ICD-10-CM | POA: Diagnosis not present

## 2016-07-28 DIAGNOSIS — Z79899 Other long term (current) drug therapy: Secondary | ICD-10-CM | POA: Diagnosis not present

## 2016-07-28 DIAGNOSIS — M810 Age-related osteoporosis without current pathological fracture: Secondary | ICD-10-CM | POA: Diagnosis not present

## 2016-07-28 DIAGNOSIS — M5090 Cervical disc disorder, unspecified, unspecified cervical region: Secondary | ICD-10-CM | POA: Diagnosis not present

## 2016-08-04 DIAGNOSIS — M545 Low back pain: Secondary | ICD-10-CM | POA: Diagnosis not present

## 2016-08-14 DIAGNOSIS — M545 Low back pain: Secondary | ICD-10-CM | POA: Diagnosis not present

## 2016-08-18 DIAGNOSIS — M545 Low back pain: Secondary | ICD-10-CM | POA: Diagnosis not present

## 2016-08-18 DIAGNOSIS — M25651 Stiffness of right hip, not elsewhere classified: Secondary | ICD-10-CM | POA: Diagnosis not present

## 2016-08-18 DIAGNOSIS — M6250 Muscle wasting and atrophy, not elsewhere classified, unspecified site: Secondary | ICD-10-CM | POA: Diagnosis not present

## 2016-08-18 DIAGNOSIS — M6249 Contracture of muscle, multiple sites: Secondary | ICD-10-CM | POA: Diagnosis not present

## 2016-08-22 DIAGNOSIS — M25651 Stiffness of right hip, not elsewhere classified: Secondary | ICD-10-CM | POA: Diagnosis not present

## 2016-08-22 DIAGNOSIS — M6249 Contracture of muscle, multiple sites: Secondary | ICD-10-CM | POA: Diagnosis not present

## 2016-08-22 DIAGNOSIS — M6250 Muscle wasting and atrophy, not elsewhere classified, unspecified site: Secondary | ICD-10-CM | POA: Diagnosis not present

## 2016-08-22 DIAGNOSIS — M545 Low back pain: Secondary | ICD-10-CM | POA: Diagnosis not present

## 2016-08-24 DIAGNOSIS — M545 Low back pain: Secondary | ICD-10-CM | POA: Diagnosis not present

## 2016-08-24 DIAGNOSIS — M25651 Stiffness of right hip, not elsewhere classified: Secondary | ICD-10-CM | POA: Diagnosis not present

## 2016-08-24 DIAGNOSIS — M6249 Contracture of muscle, multiple sites: Secondary | ICD-10-CM | POA: Diagnosis not present

## 2016-08-24 DIAGNOSIS — M6250 Muscle wasting and atrophy, not elsewhere classified, unspecified site: Secondary | ICD-10-CM | POA: Diagnosis not present

## 2016-08-28 DIAGNOSIS — M25651 Stiffness of right hip, not elsewhere classified: Secondary | ICD-10-CM | POA: Diagnosis not present

## 2016-08-28 DIAGNOSIS — M545 Low back pain: Secondary | ICD-10-CM | POA: Diagnosis not present

## 2016-08-28 DIAGNOSIS — M6250 Muscle wasting and atrophy, not elsewhere classified, unspecified site: Secondary | ICD-10-CM | POA: Diagnosis not present

## 2016-08-28 DIAGNOSIS — M6249 Contracture of muscle, multiple sites: Secondary | ICD-10-CM | POA: Diagnosis not present

## 2016-08-30 DIAGNOSIS — M25651 Stiffness of right hip, not elsewhere classified: Secondary | ICD-10-CM | POA: Diagnosis not present

## 2016-08-30 DIAGNOSIS — M6250 Muscle wasting and atrophy, not elsewhere classified, unspecified site: Secondary | ICD-10-CM | POA: Diagnosis not present

## 2016-08-30 DIAGNOSIS — M545 Low back pain: Secondary | ICD-10-CM | POA: Diagnosis not present

## 2016-08-30 DIAGNOSIS — M6249 Contracture of muscle, multiple sites: Secondary | ICD-10-CM | POA: Diagnosis not present

## 2016-09-04 DIAGNOSIS — M6249 Contracture of muscle, multiple sites: Secondary | ICD-10-CM | POA: Diagnosis not present

## 2016-09-04 DIAGNOSIS — M25651 Stiffness of right hip, not elsewhere classified: Secondary | ICD-10-CM | POA: Diagnosis not present

## 2016-09-04 DIAGNOSIS — M545 Low back pain: Secondary | ICD-10-CM | POA: Diagnosis not present

## 2016-09-04 DIAGNOSIS — M6250 Muscle wasting and atrophy, not elsewhere classified, unspecified site: Secondary | ICD-10-CM | POA: Diagnosis not present

## 2016-09-06 DIAGNOSIS — M6250 Muscle wasting and atrophy, not elsewhere classified, unspecified site: Secondary | ICD-10-CM | POA: Diagnosis not present

## 2016-09-06 DIAGNOSIS — M25651 Stiffness of right hip, not elsewhere classified: Secondary | ICD-10-CM | POA: Diagnosis not present

## 2016-09-06 DIAGNOSIS — M545 Low back pain: Secondary | ICD-10-CM | POA: Diagnosis not present

## 2016-09-06 DIAGNOSIS — M6249 Contracture of muscle, multiple sites: Secondary | ICD-10-CM | POA: Diagnosis not present

## 2016-09-11 DIAGNOSIS — M6250 Muscle wasting and atrophy, not elsewhere classified, unspecified site: Secondary | ICD-10-CM | POA: Diagnosis not present

## 2016-09-11 DIAGNOSIS — M25651 Stiffness of right hip, not elsewhere classified: Secondary | ICD-10-CM | POA: Diagnosis not present

## 2016-09-11 DIAGNOSIS — M545 Low back pain: Secondary | ICD-10-CM | POA: Diagnosis not present

## 2016-09-11 DIAGNOSIS — M6249 Contracture of muscle, multiple sites: Secondary | ICD-10-CM | POA: Diagnosis not present

## 2016-09-13 DIAGNOSIS — M25651 Stiffness of right hip, not elsewhere classified: Secondary | ICD-10-CM | POA: Diagnosis not present

## 2016-09-13 DIAGNOSIS — M6250 Muscle wasting and atrophy, not elsewhere classified, unspecified site: Secondary | ICD-10-CM | POA: Diagnosis not present

## 2016-09-13 DIAGNOSIS — M545 Low back pain: Secondary | ICD-10-CM | POA: Diagnosis not present

## 2016-09-13 DIAGNOSIS — M6249 Contracture of muscle, multiple sites: Secondary | ICD-10-CM | POA: Diagnosis not present

## 2016-09-18 DIAGNOSIS — M545 Low back pain: Secondary | ICD-10-CM | POA: Diagnosis not present

## 2016-09-21 DIAGNOSIS — M6250 Muscle wasting and atrophy, not elsewhere classified, unspecified site: Secondary | ICD-10-CM | POA: Diagnosis not present

## 2016-09-21 DIAGNOSIS — M6249 Contracture of muscle, multiple sites: Secondary | ICD-10-CM | POA: Diagnosis not present

## 2016-09-21 DIAGNOSIS — M25651 Stiffness of right hip, not elsewhere classified: Secondary | ICD-10-CM | POA: Diagnosis not present

## 2016-09-21 DIAGNOSIS — M545 Low back pain: Secondary | ICD-10-CM | POA: Diagnosis not present

## 2016-09-25 DIAGNOSIS — M6250 Muscle wasting and atrophy, not elsewhere classified, unspecified site: Secondary | ICD-10-CM | POA: Diagnosis not present

## 2016-09-25 DIAGNOSIS — M545 Low back pain: Secondary | ICD-10-CM | POA: Diagnosis not present

## 2016-09-25 DIAGNOSIS — M25651 Stiffness of right hip, not elsewhere classified: Secondary | ICD-10-CM | POA: Diagnosis not present

## 2016-09-25 DIAGNOSIS — M6249 Contracture of muscle, multiple sites: Secondary | ICD-10-CM | POA: Diagnosis not present

## 2016-09-27 DIAGNOSIS — M6250 Muscle wasting and atrophy, not elsewhere classified, unspecified site: Secondary | ICD-10-CM | POA: Diagnosis not present

## 2016-09-27 DIAGNOSIS — M545 Low back pain: Secondary | ICD-10-CM | POA: Diagnosis not present

## 2016-09-27 DIAGNOSIS — M6249 Contracture of muscle, multiple sites: Secondary | ICD-10-CM | POA: Diagnosis not present

## 2016-09-27 DIAGNOSIS — M25651 Stiffness of right hip, not elsewhere classified: Secondary | ICD-10-CM | POA: Diagnosis not present

## 2016-10-18 DIAGNOSIS — Z1231 Encounter for screening mammogram for malignant neoplasm of breast: Secondary | ICD-10-CM | POA: Diagnosis not present

## 2016-11-14 DIAGNOSIS — M545 Low back pain: Secondary | ICD-10-CM | POA: Diagnosis not present

## 2016-12-08 DIAGNOSIS — M5137 Other intervertebral disc degeneration, lumbosacral region: Secondary | ICD-10-CM | POA: Diagnosis not present

## 2016-12-08 DIAGNOSIS — M545 Low back pain: Secondary | ICD-10-CM | POA: Diagnosis not present

## 2016-12-08 DIAGNOSIS — M5136 Other intervertebral disc degeneration, lumbar region: Secondary | ICD-10-CM | POA: Diagnosis not present

## 2016-12-27 DIAGNOSIS — M545 Low back pain: Secondary | ICD-10-CM | POA: Diagnosis not present

## 2017-03-12 IMAGING — CR DG CERVICAL SPINE 2 OR 3 VIEWS
4 series · 4 of 4 positions shown · non-contrast
Comparison: None in PACs

CLINICAL DATA: Four months of right-sided neck pain radiating to
the upper shoulder intermittently, no history of trauma.

EXAM:
CERVICAL SPINE - 2-3 VIEW

[w cervical spine lat]
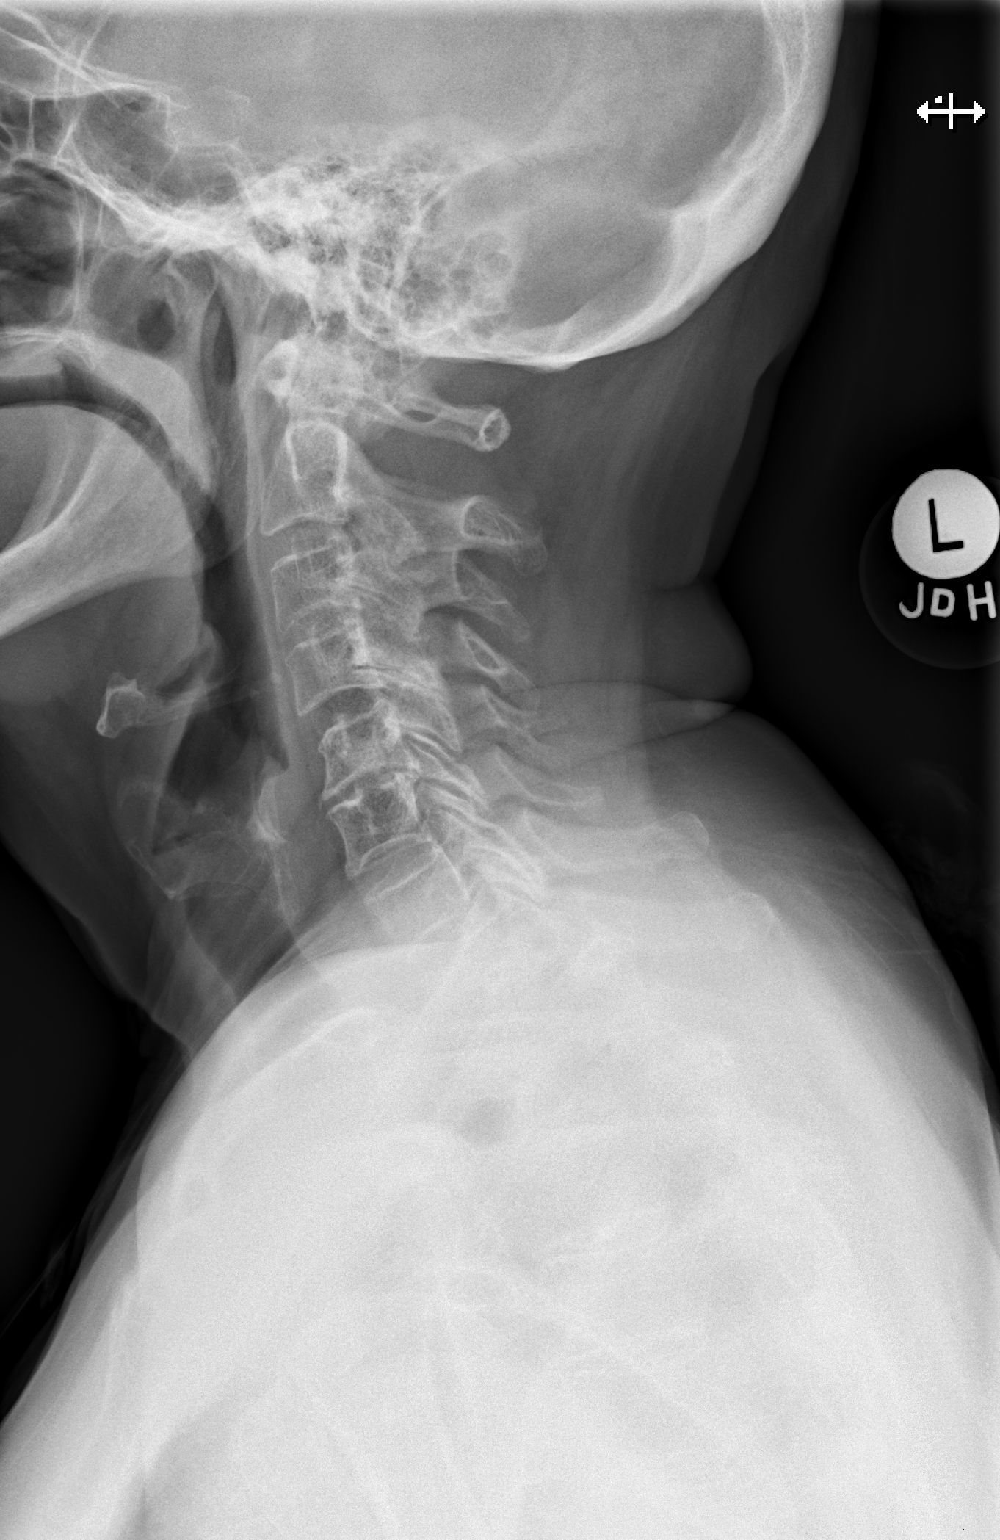

[w cervical spine ap]
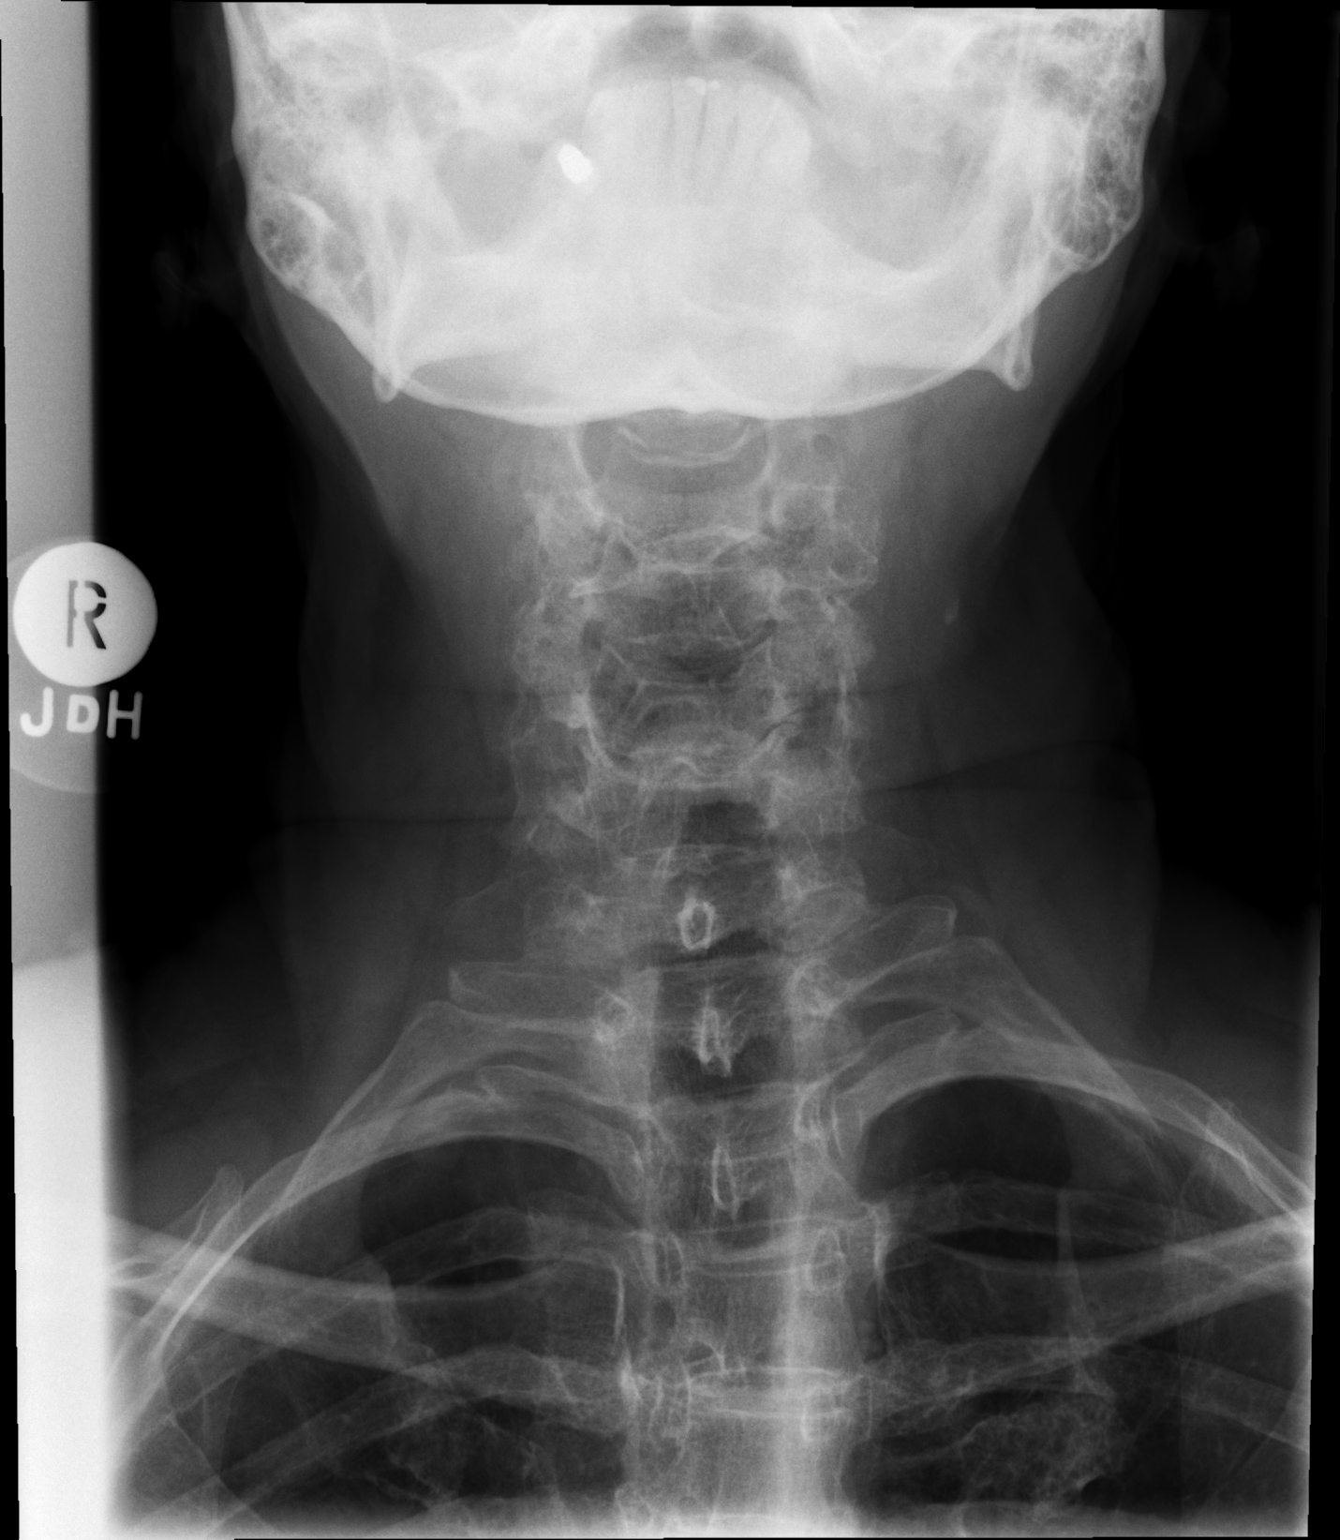

[w cervical swimmers]
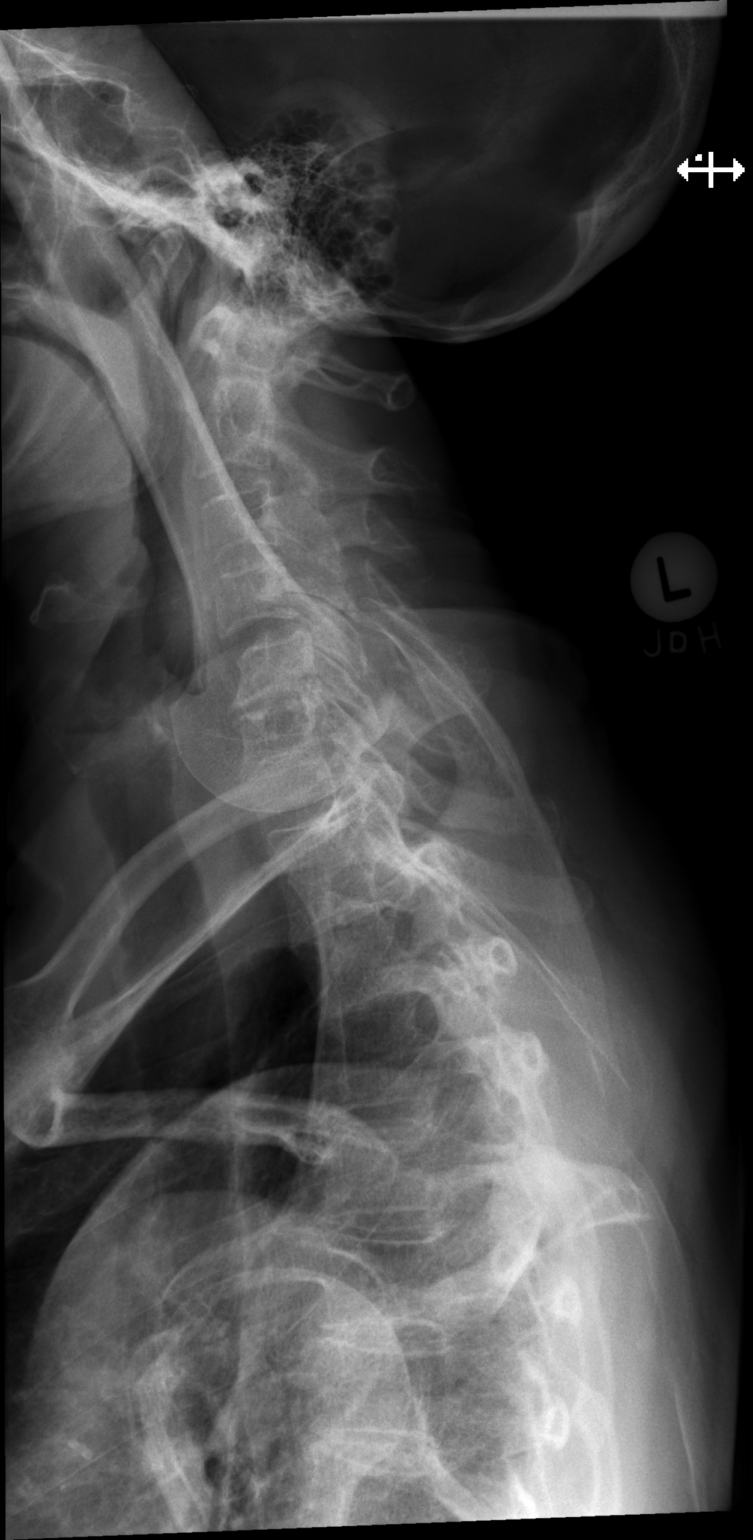

[t cervical spine odontoid]
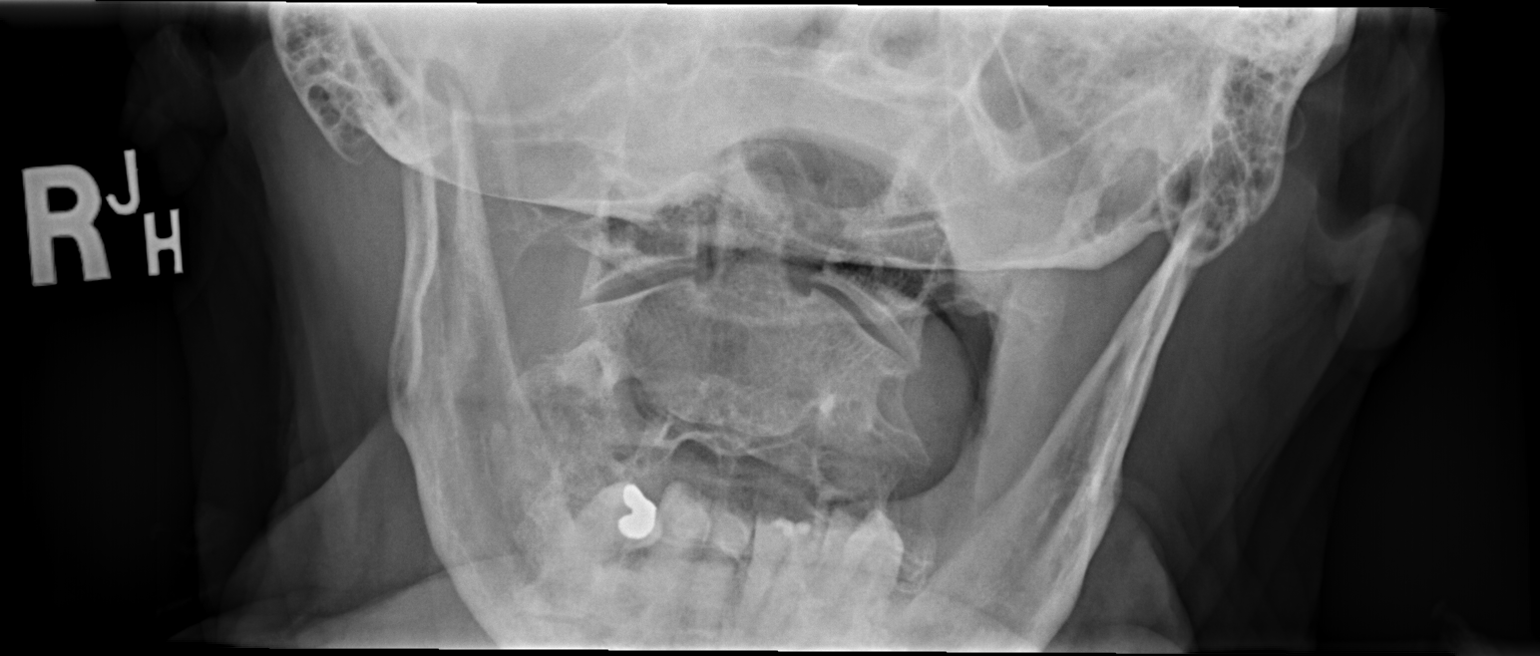

[4 of 4 positions shown; findings below may reference images not displayed]

FINDINGS: The cervical vertebral bodies are preserved in height. There is
moderate disc space narrowing at C3-4. There is facet joint
hypertrophy at multiple levels. The spinous processes and odontoid
are intact. The prevertebral soft tissue spaces are normal.
IMPRESSION: There is moderate degenerative disc and facet joint change centered
at C5-6 and to a lesser extent at C3-4.

Given the intermittent right upper extremity radicular symptoms,
cervical spine MRI may be useful.

## 2017-04-13 DIAGNOSIS — Z23 Encounter for immunization: Secondary | ICD-10-CM | POA: Diagnosis not present

## 2017-08-07 DIAGNOSIS — H353131 Nonexudative age-related macular degeneration, bilateral, early dry stage: Secondary | ICD-10-CM | POA: Diagnosis not present

## 2017-08-07 DIAGNOSIS — H35443 Age-related reticular degeneration of retina, bilateral: Secondary | ICD-10-CM | POA: Diagnosis not present

## 2017-08-23 DIAGNOSIS — M5136 Other intervertebral disc degeneration, lumbar region: Secondary | ICD-10-CM | POA: Diagnosis not present

## 2017-08-28 DIAGNOSIS — M5090 Cervical disc disorder, unspecified, unspecified cervical region: Secondary | ICD-10-CM | POA: Diagnosis not present

## 2017-08-28 DIAGNOSIS — E7800 Pure hypercholesterolemia, unspecified: Secondary | ICD-10-CM | POA: Diagnosis not present

## 2017-08-28 DIAGNOSIS — E781 Pure hyperglyceridemia: Secondary | ICD-10-CM | POA: Diagnosis not present

## 2017-08-28 DIAGNOSIS — E559 Vitamin D deficiency, unspecified: Secondary | ICD-10-CM | POA: Diagnosis not present

## 2017-08-28 DIAGNOSIS — E663 Overweight: Secondary | ICD-10-CM | POA: Diagnosis not present

## 2017-08-28 DIAGNOSIS — Z79899 Other long term (current) drug therapy: Secondary | ICD-10-CM | POA: Diagnosis not present

## 2017-08-28 DIAGNOSIS — G479 Sleep disorder, unspecified: Secondary | ICD-10-CM | POA: Diagnosis not present

## 2017-08-28 DIAGNOSIS — M810 Age-related osteoporosis without current pathological fracture: Secondary | ICD-10-CM | POA: Diagnosis not present

## 2017-11-05 DIAGNOSIS — Z1231 Encounter for screening mammogram for malignant neoplasm of breast: Secondary | ICD-10-CM | POA: Diagnosis not present

## 2017-12-04 DIAGNOSIS — J309 Allergic rhinitis, unspecified: Secondary | ICD-10-CM | POA: Diagnosis not present

## 2017-12-04 DIAGNOSIS — J209 Acute bronchitis, unspecified: Secondary | ICD-10-CM | POA: Diagnosis not present

## 2018-07-22 DIAGNOSIS — Z23 Encounter for immunization: Secondary | ICD-10-CM | POA: Diagnosis not present

## 2018-08-28 DIAGNOSIS — M5090 Cervical disc disorder, unspecified, unspecified cervical region: Secondary | ICD-10-CM | POA: Diagnosis not present

## 2018-08-28 DIAGNOSIS — M810 Age-related osteoporosis without current pathological fracture: Secondary | ICD-10-CM | POA: Diagnosis not present

## 2018-08-28 DIAGNOSIS — G479 Sleep disorder, unspecified: Secondary | ICD-10-CM | POA: Diagnosis not present

## 2018-08-28 DIAGNOSIS — E559 Vitamin D deficiency, unspecified: Secondary | ICD-10-CM | POA: Diagnosis not present

## 2018-08-28 DIAGNOSIS — Z79899 Other long term (current) drug therapy: Secondary | ICD-10-CM | POA: Diagnosis not present

## 2018-08-28 DIAGNOSIS — E7800 Pure hypercholesterolemia, unspecified: Secondary | ICD-10-CM | POA: Diagnosis not present

## 2018-08-28 DIAGNOSIS — E663 Overweight: Secondary | ICD-10-CM | POA: Diagnosis not present

## 2018-09-17 DIAGNOSIS — Z1211 Encounter for screening for malignant neoplasm of colon: Secondary | ICD-10-CM | POA: Diagnosis not present

## 2019-02-01 DIAGNOSIS — Z1231 Encounter for screening mammogram for malignant neoplasm of breast: Secondary | ICD-10-CM | POA: Diagnosis not present

## 2019-05-12 DIAGNOSIS — Z23 Encounter for immunization: Secondary | ICD-10-CM | POA: Diagnosis not present

## 2019-05-23 DIAGNOSIS — H353131 Nonexudative age-related macular degeneration, bilateral, early dry stage: Secondary | ICD-10-CM | POA: Diagnosis not present

## 2019-09-03 DIAGNOSIS — M810 Age-related osteoporosis without current pathological fracture: Secondary | ICD-10-CM | POA: Diagnosis not present

## 2019-09-03 DIAGNOSIS — Z1211 Encounter for screening for malignant neoplasm of colon: Secondary | ICD-10-CM | POA: Diagnosis not present

## 2019-09-03 DIAGNOSIS — Z79899 Other long term (current) drug therapy: Secondary | ICD-10-CM | POA: Diagnosis not present

## 2019-09-03 DIAGNOSIS — E559 Vitamin D deficiency, unspecified: Secondary | ICD-10-CM | POA: Diagnosis not present

## 2019-09-03 DIAGNOSIS — E663 Overweight: Secondary | ICD-10-CM | POA: Diagnosis not present

## 2019-09-03 DIAGNOSIS — Z6827 Body mass index (BMI) 27.0-27.9, adult: Secondary | ICD-10-CM | POA: Diagnosis not present

## 2019-09-03 DIAGNOSIS — Z86010 Personal history of colonic polyps: Secondary | ICD-10-CM | POA: Diagnosis not present

## 2019-09-03 DIAGNOSIS — E7800 Pure hypercholesterolemia, unspecified: Secondary | ICD-10-CM | POA: Diagnosis not present

## 2019-09-03 DIAGNOSIS — G479 Sleep disorder, unspecified: Secondary | ICD-10-CM | POA: Diagnosis not present

## 2019-09-03 DIAGNOSIS — Z0000 Encounter for general adult medical examination without abnormal findings: Secondary | ICD-10-CM | POA: Diagnosis not present

## 2019-09-03 DIAGNOSIS — M545 Low back pain: Secondary | ICD-10-CM | POA: Diagnosis not present

## 2019-10-16 DIAGNOSIS — Z1211 Encounter for screening for malignant neoplasm of colon: Secondary | ICD-10-CM | POA: Diagnosis not present

## 2020-02-25 DIAGNOSIS — Z1231 Encounter for screening mammogram for malignant neoplasm of breast: Secondary | ICD-10-CM | POA: Diagnosis not present

## 2020-07-02 DIAGNOSIS — H35363 Drusen (degenerative) of macula, bilateral: Secondary | ICD-10-CM | POA: Diagnosis not present

## 2020-07-02 DIAGNOSIS — H353131 Nonexudative age-related macular degeneration, bilateral, early dry stage: Secondary | ICD-10-CM | POA: Diagnosis not present

## 2020-07-02 DIAGNOSIS — H34232 Retinal artery branch occlusion, left eye: Secondary | ICD-10-CM | POA: Diagnosis not present

## 2020-07-06 DIAGNOSIS — M810 Age-related osteoporosis without current pathological fracture: Secondary | ICD-10-CM | POA: Diagnosis not present

## 2020-07-06 DIAGNOSIS — H353190 Nonexudative age-related macular degeneration, unspecified eye, stage unspecified: Secondary | ICD-10-CM | POA: Diagnosis not present

## 2020-07-06 DIAGNOSIS — E7800 Pure hypercholesterolemia, unspecified: Secondary | ICD-10-CM | POA: Diagnosis not present

## 2020-07-06 DIAGNOSIS — E559 Vitamin D deficiency, unspecified: Secondary | ICD-10-CM | POA: Diagnosis not present

## 2020-07-06 DIAGNOSIS — H348392 Tributary (branch) retinal vein occlusion, unspecified eye, stable: Secondary | ICD-10-CM | POA: Diagnosis not present

## 2020-07-06 DIAGNOSIS — M79644 Pain in right finger(s): Secondary | ICD-10-CM | POA: Diagnosis not present

## 2020-07-06 DIAGNOSIS — H35369 Drusen (degenerative) of macula, unspecified eye: Secondary | ICD-10-CM | POA: Diagnosis not present

## 2020-07-29 DIAGNOSIS — Z20822 Contact with and (suspected) exposure to covid-19: Secondary | ICD-10-CM | POA: Diagnosis not present

## 2020-09-06 ENCOUNTER — Encounter: Payer: Self-pay | Admitting: Cardiology

## 2020-09-06 ENCOUNTER — Encounter: Payer: Self-pay | Admitting: *Deleted

## 2020-09-06 DIAGNOSIS — M545 Low back pain, unspecified: Secondary | ICD-10-CM | POA: Diagnosis not present

## 2020-09-06 DIAGNOSIS — E663 Overweight: Secondary | ICD-10-CM | POA: Diagnosis not present

## 2020-09-06 DIAGNOSIS — Z0000 Encounter for general adult medical examination without abnormal findings: Secondary | ICD-10-CM | POA: Diagnosis not present

## 2020-09-06 DIAGNOSIS — M810 Age-related osteoporosis without current pathological fracture: Secondary | ICD-10-CM | POA: Diagnosis not present

## 2020-09-06 DIAGNOSIS — G479 Sleep disorder, unspecified: Secondary | ICD-10-CM | POA: Diagnosis not present

## 2020-09-06 DIAGNOSIS — E7800 Pure hypercholesterolemia, unspecified: Secondary | ICD-10-CM | POA: Diagnosis not present

## 2020-09-06 DIAGNOSIS — Z6827 Body mass index (BMI) 27.0-27.9, adult: Secondary | ICD-10-CM | POA: Diagnosis not present

## 2020-09-06 DIAGNOSIS — Z1211 Encounter for screening for malignant neoplasm of colon: Secondary | ICD-10-CM | POA: Diagnosis not present

## 2020-09-06 NOTE — Progress Notes (Signed)
Cardiology Office Note  Date: 09/07/2020   ID: Natasha, Madden [birth year], MRN [id number]  PCP:  Kathyrn Lass, MD  Cardiologist:  Rozann Lesches, MD Electrophysiologist:  None   Chief Complaint  Patient presents with  . History of retinal artery branch occlusion    History of Present Illness: Natasha Madden is an 80 y.o. female referred for cardiology consultation by Mr. Dayna Ramus NP with Sadie [facility] at [facility] with a history of retinal artery branch occlusion involving the left eye.  She reports no history of palpitations or known cardiac arrhythmia.  No known history of carotid artery disease.  I personally reviewed her ECG today which shows normal sinus rhythm.  She is being treated for hyperlipidemia, on Zocor.  Most recent LDL was 102.  She was evaluated by Dr. Ron Parker back in 2015 at which point echocardiogram revealed normal LVEF.  Past Medical History:  Diagnosis Date  . Atypical pneumonia   . Colon polyp   . Degenerative disc disease, cervical   . Diverticulosis   . Hyperlipidemia   . Insomnia   . Osteoporosis     Past Surgical History:  Procedure Laterality Date  . ABDOMINAL HYSTERECTOMY  1970's  . FOOT SURGERY    . Skin cancer resection  06/16/13   lower lip    Current Outpatient Medications  Medication Sig Dispense Refill  . Multiple Vitamin (MULTIVITAMIN) tablet Take 1 tablet by mouth daily.    . simvastatin (ZOCOR) 40 MG tablet Take 40 mg by mouth daily.    . traMADol (ULTRAM) 50 MG tablet Take by mouth every 6 (six) hours as needed.    . zolpidem (AMBIEN) 5 MG tablet Take 5 mg by mouth at bedtime.     No current facility-administered medications for this visit.   Allergies:  Fosamax [alendronate] and Lipitor [atorvastatin]   Social History: The patient  reports that she has never smoked. She has never used smokeless tobacco. She reports current alcohol use. She reports that she does not use drugs.   Family History: The patient's family history  includes Diabetes in her sister; Heart disease in her sister; Heart failure in her mother; Pneumonia (age of onset: 14) in her father.   ROS: No syncope.  Chronic back pain.  Physical Exam: VS:  BP 118/72   Pulse 75   Ht 5\' 3"  (1.6 m)   Wt 156 lb (70.8 kg)   SpO2 94%   BMI 27.63 kg/m , BMI Body mass index is 27.63 kg/m.  Wt Readings from Last 3 Encounters:  09/07/20 156 lb (70.8 kg)  07/29/14 162 lb (73.5 kg)  01/26/14 163 lb (73.9 kg)    General: Elderly woman, appears comfortable at rest. HEENT: Conjunctiva and lids normal, wearing a mask. Neck: Supple, no elevated JVP or carotid bruits, no thyromegaly. Lungs: Clear to auscultation, nonlabored breathing at rest. Cardiac: Regular rate and rhythm, no S3, 2/6 basal systolic murmur, no pericardial rub. Abdomen: Soft, nontender, bowel sounds present. Extremities: No pitting edema, distal pulses 2+. Skin: Warm and dry. Musculoskeletal: No kyphosis. Neuropsychiatric: Alert and oriented x3, affect grossly appropriate.  ECG:  An ECG dated 01/26/2014 was personally reviewed today and demonstrated:  Sinus rhythm with small R' in lead V1, nonspecific ST changes.  Recent Labwork:  December 2021: Hemoglobin 13.2, platelets 192, BUN 15, creatinine 0.74, potassium 4.3, AST 20, ALT 11, cholesterol 169, HDL 49, LDL 102, triglycerides 99  Other Studies Reviewed Today:  Echocardiogram 01/28/2014: - Left ventricle: The  cavity size was normal. Wall thickness was  increased in a pattern of mild LVH. Systolic function was normal.  The estimated ejection fraction was in the range of 60% to 65%.  Wall motion was normal; there were no regional wall motion  abnormalities. Doppler parameters are consistent with abnormal  left ventricular relaxation (grade 1 diastolic dysfunction).  - Aortic valve: Mildly calcified annulus. Trileaflet.  - Mitral valve: Mildly calcified annulus. Mildly thickened leaflets. There   was mild regurgitation.  -  Tricuspid valve: There was mild regurgitation.  - Pulmonic valve: There was mild regurgitation.  - Pulmonary arteries: PA peak pressure: 35 mm Hg (S). Mildly  elevated pulmonary pressures.   Assessment and Plan:  1.  Retinal artery branch occlusion of the left eye.  She is referred for general cardiovascular assessment.  No known history of atrial arrhythmias or significant carotid artery disease.  Plan to obtain a 14-day Zio patch to screen for atrial fibrillation.  Also carotid Dopplers.  For now would continue present regimen including Zocor.  2.  Systolic murmur in aortic position.  Obtain follow-up echocardiogram.  3.  Mixed hyperlipidemia, on Zocor.  Recent LDL 102.  Medication Adjustments/Labs and Tests Ordered: Current medicines are reviewed at length with the patient today.  Concerns regarding medicines are outlined above.   Tests Ordered: Orders Placed This Encounter  Procedures  . EKG 12-Lead  . ECHOCARDIOGRAM COMPLETE  . VAS US CAROTID    Medication Changes: No orders of the defined types were placed in this encounter.   Disposition:  Follow up test results.  Signed, Satira Sark, MD, [facility] 09/07/2020 11:15 AM    [facility] [place], [place], [place] [address] Phone: [phone]; Fax: [phone]

## 2020-09-07 ENCOUNTER — Ambulatory Visit (INDEPENDENT_AMBULATORY_CARE_PROVIDER_SITE_OTHER): Payer: Medicare Other

## 2020-09-07 ENCOUNTER — Ambulatory Visit (INDEPENDENT_AMBULATORY_CARE_PROVIDER_SITE_OTHER): Payer: Medicare Other | Admitting: Cardiology

## 2020-09-07 ENCOUNTER — Telehealth: Payer: Self-pay | Admitting: Cardiology

## 2020-09-07 ENCOUNTER — Other Ambulatory Visit: Payer: Self-pay | Admitting: Cardiology

## 2020-09-07 ENCOUNTER — Encounter: Payer: Self-pay | Admitting: Cardiology

## 2020-09-07 VITALS — BP 118/72 | HR 75 | Ht 63.0 in | Wt 156.0 lb

## 2020-09-07 DIAGNOSIS — H34232 Retinal artery branch occlusion, left eye: Secondary | ICD-10-CM

## 2020-09-07 DIAGNOSIS — E782 Mixed hyperlipidemia: Secondary | ICD-10-CM

## 2020-09-07 DIAGNOSIS — R011 Cardiac murmur, unspecified: Secondary | ICD-10-CM | POA: Diagnosis not present

## 2020-09-07 NOTE — Telephone Encounter (Signed)
Pre-cert Verification for the following procedure    [place] CARTOID DUPLEX ECHO   DATE:    09/16/2020  LOCATION: [facility]

## 2020-09-07 NOTE — Telephone Encounter (Signed)
[place] for the following:  14 Day ZIO XT/[place]

## 2020-09-07 NOTE — Addendum Note (Signed)
Addended by: Merlene Laughter on: 09/07/2020 11:48 AM   Modules accepted: Orders

## 2020-09-07 NOTE — Patient Instructions (Addendum)
Medication Instructions:   Your physician recommends that you continue on your current medications as directed. Please refer to the Current Medication list given to you today.  Labwork:  none  Testing/Procedures: Your physician has requested that you have a carotid duplex. This test is an ultrasound of the carotid arteries in your neck. It looks at blood flow through these arteries that supply the brain with blood. Allow one hour for this exam. There are no restrictions or special instructions. Your physician has requested that you have an echocardiogram. Echocardiography is a painless test that uses sound waves to create images of your heart. It provides your doctor with information about the size and shape of your heart and how well your heart's chambers and valves are working. This procedure takes approximately one hour. There are no restrictions for this procedure. ZIO- Long Term Monitor Instructions   Your physician has requested you wear your ZIO patch monitor 14 days.   This is a single patch monitor.  [employer] supplies one patch monitor per enrollment.  Additional stickers are not available.   Please do not apply patch if you will be having a Nuclear Stress Test, Echocardiogram, Cardiac CT, MRI, or Chest Xray during the time frame you would be wearing the monitor. The patch cannot be worn during these tests.  You cannot remove and re-apply the ZIO XT patch monitor.   Your ZIO patch monitor will be sent USPS Priority mail from [employer] [facility] directly to your home address. The monitor may also be mailed to a PO BOX if home delivery is not available.   It may take 3-5 days to receive your monitor after you have been enrolled.   Once you have received you monitor, please review enclosed instructions.  Your monitor has already been registered assigning a specific monitor serial # to you.   Applying the monitor   Shave hair from upper left chest.   Hold abrader disc by orange tab.   Rub abrader in 40 strokes over left upper chest as indicated in your monitor instructions.   Clean area with 4 enclosed alcohol pads .  Use all pads to assure are is cleaned thoroughly.  Let dry.   Apply patch as indicated in monitor instructions.  Patch will be place under collarbone on left side of chest with arrow pointing upward.   Rub patch adhesive wings for 2 minutes.Remove white label marked "1".  Remove white label marked "2".  Rub patch adhesive wings for 2 additional minutes.   While looking in a mirror, press and release button in center of patch.  A small green light will flash 3-4 times .  This will be your only indicator the monitor has been turned on.     Do not shower for the first 24 hours.  You may shower after the first 24 hours.   Press button if you feel a symptom. You will hear a small click.  Record Date, Time and Symptom in the Patient Log Book.   When you are ready to remove patch, follow instructions on last 2 pages of Patient Log Book.  Stick patch monitor onto last page of Patient Log Book.   Place Patient Log Book in [place] box.  Use locking tab on box and tape box closed securely.  The [employer] and [employer] has [employer] on it.  Please place in mailbox as soon as possible.  Your physician should have your test results approximately 7 days after the monitor has been  mailed back to [facility].   Call [place] at [phone] if you have questions regarding your ZIO XT patch monitor.  Call them immediately if you see an orange light blinking on your monitor.    If your monitor falls off in less than 4 days contact our Monitor department at [phone].  If your monitor becomes loose or falls off after 4 days call [employer] at [phone] for suggestions on securing your monitor.  Follow-Up:  Your physician recommends that you schedule a follow-up appointment in: pending.  Any Other Special Instructions Will Be Listed Below (If  Applicable).  If you need a refill on your cardiac medications before your next appointment, please call your pharmacy.

## 2020-09-16 ENCOUNTER — Ambulatory Visit (HOSPITAL_COMMUNITY)
Admission: RE | Admit: 2020-09-16 | Discharge: 2020-09-16 | Disposition: A | Discharge status: Home / Self Care | Payer: Medicare Other | Source: Ambulatory Visit | Attending: Cardiology | Admitting: Cardiology

## 2020-09-16 ENCOUNTER — Other Ambulatory Visit: Payer: Self-pay

## 2020-09-16 ENCOUNTER — Ambulatory Visit (HOSPITAL_BASED_OUTPATIENT_CLINIC_OR_DEPARTMENT_OTHER)
Admission: RE | Admit: 2020-09-16 | Discharge: 2020-09-16 | Disposition: A | Discharge status: Home / Self Care | Payer: Medicare Other | Source: Ambulatory Visit | Attending: Cardiology | Admitting: Cardiology

## 2020-09-16 ENCOUNTER — Telehealth: Payer: Self-pay | Admitting: *Deleted

## 2020-09-16 DIAGNOSIS — R011 Cardiac murmur, unspecified: Secondary | ICD-10-CM

## 2020-09-16 DIAGNOSIS — I6523 Occlusion and stenosis of bilateral carotid arteries: Secondary | ICD-10-CM | POA: Diagnosis not present

## 2020-09-16 DIAGNOSIS — H34232 Retinal artery branch occlusion, left eye: Secondary | ICD-10-CM | POA: Diagnosis not present

## 2020-09-16 DIAGNOSIS — H349 Unspecified retinal vascular occlusion: Secondary | ICD-10-CM | POA: Diagnosis not present

## 2020-09-16 LAB — ECHOCARDIOGRAM COMPLETE
Area-P 1/2: 3.63 cm2
MV M vel: 5.84 m/s
MV Peak grad: 136.4 mmHg
Radius: 0.6 cm
S' Lateral: 2.7 cm

## 2020-09-16 NOTE — Progress Notes (Signed)
*  PRELIMINARY RESULTS* Echocardiogram 2D Echocardiogram has been performed.  Samuel Germany 09/16/2020, 12:16 PM

## 2020-09-16 NOTE — Telephone Encounter (Signed)
-----   Message from Satira Sark, MD sent at 09/16/2020 12:38 PM EST ----- Results reviewed.  Cardiac function is normal with LVEF 55 to 60%.  There is mild to moderate mitral and tricuspid regurgitation, would not necessarily expect any symptoms related to this at this point.  Aortic annular calcification is noted but there is no stenosis.  Additional studies are pending for review.

## 2020-09-16 NOTE — Telephone Encounter (Signed)
Pt voiced understanding

## 2020-09-18 DIAGNOSIS — H34232 Retinal artery branch occlusion, left eye: Secondary | ICD-10-CM | POA: Diagnosis not present

## 2020-09-18 DIAGNOSIS — R011 Cardiac murmur, unspecified: Secondary | ICD-10-CM | POA: Diagnosis not present

## 2020-09-22 ENCOUNTER — Telehealth: Payer: Self-pay | Admitting: *Deleted

## 2020-09-22 NOTE — Telephone Encounter (Addendum)
Patient informed. Copy sent to PCP Reports she can not take aspirin due to it causing indigestion

## 2020-09-22 NOTE — Telephone Encounter (Signed)
-----   Message from Satira Sark, MD sent at 09/17/2020  8:16 AM EST ----- Results reviewed.  Carotid Doppler showed moderate ICA atherosclerosis, but no stenosis to require intervention.  Would suggest medical therapy including low-dose aspirin and statin, please forward results to PCP.

## 2020-09-24 DIAGNOSIS — Z1211 Encounter for screening for malignant neoplasm of colon: Secondary | ICD-10-CM | POA: Diagnosis not present

## 2020-10-06 DIAGNOSIS — H34232 Retinal artery branch occlusion, left eye: Secondary | ICD-10-CM | POA: Diagnosis not present

## 2020-10-06 DIAGNOSIS — R011 Cardiac murmur, unspecified: Secondary | ICD-10-CM | POA: Diagnosis not present

## 2020-10-12 ENCOUNTER — Telehealth: Payer: Self-pay | Admitting: *Deleted

## 2020-10-13 MED ORDER — METOPROLOL SUCCINATE ER 25 MG PO TB24: 12.5000 mg | ORAL_TABLET | Freq: Every day | ORAL | 3 refills | Status: DC

## 2020-10-13 NOTE — Telephone Encounter (Signed)
Patient informed and verbalized understanding of plan. Copy sent to PCP 

## 2020-10-21 DIAGNOSIS — H348312 Tributary (branch) retinal vein occlusion, right eye, stable: Secondary | ICD-10-CM | POA: Diagnosis not present

## 2020-10-21 DIAGNOSIS — H02055 Trichiasis without entropian left lower eyelid: Secondary | ICD-10-CM | POA: Diagnosis not present

## 2020-11-15 ENCOUNTER — Telehealth: Payer: Self-pay | Admitting: Orthopedic Surgery

## 2020-11-15 NOTE — Telephone Encounter (Signed)
Call from patient inquiring about back, specifically, disc problem for which she has been seeing Dr Nelva Bush in [place]; states mainly treating there for pain management. States would like to see Dr Aline Brochure.  Relayed to patient that we do not treat for these medical issues at our clinic, however, that I will route message to our practice administrator for advice. Patient's cell number is [phone] (Mobile)

## 2020-11-15 NOTE — Telephone Encounter (Signed)
I don't feel we can offer any further than she Natasha Madden have had already. We would likely get MRI if provider recommended, then refer her out to back specialist.  Sounds like she is already established with Dr Nelva Bush, who does injections, etc.  Not sure we can offer her more here, it would be like going backwards per se, since she is already with a specialist.

## 2020-11-16 NOTE — Telephone Encounter (Signed)
Patient relays she has heard back from her specialist in [place], Dr Nelva Bush, and has been scheduled with a surgeon for a consult appointment. Thanked [place].

## 2020-11-16 NOTE — Telephone Encounter (Signed)
Called back to patient.

## 2020-11-23 DIAGNOSIS — M25552 Pain in left hip: Secondary | ICD-10-CM | POA: Diagnosis not present

## 2020-11-23 DIAGNOSIS — M5416 Radiculopathy, lumbar region: Secondary | ICD-10-CM | POA: Diagnosis not present

## 2020-11-23 DIAGNOSIS — M47816 Spondylosis without myelopathy or radiculopathy, lumbar region: Secondary | ICD-10-CM | POA: Diagnosis not present

## 2020-12-17 DIAGNOSIS — M545 Low back pain, unspecified: Secondary | ICD-10-CM | POA: Diagnosis not present

## 2020-12-24 DIAGNOSIS — M5416 Radiculopathy, lumbar region: Secondary | ICD-10-CM | POA: Diagnosis not present

## 2020-12-29 DIAGNOSIS — M545 Low back pain, unspecified: Secondary | ICD-10-CM | POA: Diagnosis not present

## 2020-12-29 DIAGNOSIS — M5416 Radiculopathy, lumbar region: Secondary | ICD-10-CM | POA: Diagnosis not present

## 2020-12-29 DIAGNOSIS — G8929 Other chronic pain: Secondary | ICD-10-CM | POA: Diagnosis not present

## 2021-01-06 DIAGNOSIS — Z6827 Body mass index (BMI) 27.0-27.9, adult: Secondary | ICD-10-CM | POA: Diagnosis not present

## 2021-01-06 DIAGNOSIS — M5416 Radiculopathy, lumbar region: Secondary | ICD-10-CM | POA: Diagnosis not present

## 2021-01-06 DIAGNOSIS — I10 Essential (primary) hypertension: Secondary | ICD-10-CM | POA: Diagnosis not present

## 2021-02-05 DIAGNOSIS — M5416 Radiculopathy, lumbar region: Secondary | ICD-10-CM | POA: Diagnosis not present

## 2021-02-18 ENCOUNTER — Ambulatory Visit: Payer: Medicare Other | Admitting: Cardiology

## 2021-02-21 DIAGNOSIS — M5416 Radiculopathy, lumbar region: Secondary | ICD-10-CM | POA: Diagnosis not present

## 2021-02-23 NOTE — Progress Notes (Signed)
Cardiology Office Note    Date:  03/07/2021   ID:  JELESSA Madden, DOB Oct 20, 1940, MRN [id number]   PCP:  Kathyrn Lass, [facility]  Cardiologist:  Rozann Lesches, MD   Advanced Practice Provider:  No care team member to display Electrophysiologist:  None   [id number]   Chief Complaint  Patient presents with   Follow-up    History of Present Illness:  Natasha Madden is a 80 y.o. female with history of hyperlipidemia on Zocor saw Dr. Domenic Polite 09/07/2020 because of a history of retinal artery branch occlusion of the left eye.  2-week ZIO monitor was ordered and showed several episodes of PSVT and one aberrantly conducted episode of SVT.  No A. fib.  She was started on Toprol-XL 12.5 mg daily.  2D echo normal LVEF 55 to 60% with mild to moderate mitral and tricuspid regurgitation, carotid Dopplers with bilateral plaque no stenosis.  Patient comes in for f/u. No cardiac complaints. Under a lot of stress taking care of her sister who lives next door. Walks a block 2-3 times a day after shot in her hip.     Past Medical History:  Diagnosis Date   Atypical pneumonia    Colon polyp    Degenerative disc disease, cervical    Diverticulosis    Hyperlipidemia    Insomnia    Osteoporosis     Past Surgical History:  Procedure Laterality Date   ABDOMINAL HYSTERECTOMY  1970's   FOOT SURGERY     Skin cancer resection  06/16/13   lower lip    Current Medications: Current Meds  Medication Sig   aspirin EC 81 MG tablet Take 81 mg by mouth daily. Swallow whole.   Multiple Vitamin (MULTIVITAMIN) tablet Take 1 tablet by mouth daily.   simvastatin (ZOCOR) 40 MG tablet Take 40 mg by mouth daily.   traMADol (ULTRAM) 50 MG tablet Take by mouth every 6 (six) hours as needed.   zolpidem (AMBIEN) 5 MG tablet Take 5 mg by mouth at bedtime as needed.   [DISCONTINUED] metoprolol succinate (TOPROL XL) 25 MG 24 hr tablet Take 0.5 tablets (12.5 mg total) by mouth daily.      Allergies:   Fosamax [alendronate] and Lipitor [atorvastatin]   Social History   Socioeconomic History   Marital status: Married    Spouse name: Not on file   Number of children: Not on file   Years of education: Not on file   Highest education level: Not on file  Occupational History   Not on file  Tobacco Use   Smoking status: Never   Smokeless tobacco: Never  Substance and Sexual Activity   Alcohol use: Yes    Comment: social   Drug use: No   Sexual activity: Not on file  Other Topics Concern   Not on file  Social History Narrative   Not on file   Social Determinants of Health   Financial Resource Strain: Not on file  Food Insecurity: Not on file  Transportation Needs: Not on file  Physical Activity: Not on file  Stress: Not on file  Social Connections: Not on file     Family History:  The patient's  family history includes Diabetes in her sister; Heart disease in her sister; Heart failure in her mother; Pneumonia (age of onset: 59) in her father.   ROS:   Please see the history of present illness.    ROS All other systems reviewed  and are negative.   PHYSICAL EXAM:   VS:  BP 126/62   Pulse 83   Ht '5\' 3"'$  (1.6 m)   Wt 153 lb (69.4 kg)   SpO2 97%   BMI 27.10 kg/m   Physical Exam  GEN: Well nourished, well developed, in no acute distress  Neck: no JVD, carotid bruits, or masses Cardiac:RRR; no murmurs, rubs, or gallops  Respiratory:  clear to auscultation bilaterally, normal work of breathing GI: soft, nontender, nondistended, + BS Ext: without cyanosis, clubbing, or edema, Good distal pulses bilaterally Neuro:  Alert and Oriented x 3, Psych: euthymic mood, full affect  Wt Readings from Last 3 Encounters:  03/07/21 153 lb (69.4 kg)  09/07/20 156 lb (70.8 kg)  07/29/14 162 lb (73.5 kg)      Studies/Labs Reviewed:   EKG:  EKG is not ordered today.     Recent Labs: No results found for requested labs within last 8760 hours.   Lipid  Panel No results found for: CHOL, TRIG, HDL, CHOLHDL, VLDL, LDLCALC, LDLDIRECT  Additional studies/ records that were reviewed today include:  Carotid Dopplers 09/16/2020  IMPRESSION: Color duplex indicates moderate heterogeneous plaque with no hemodynamically significant stenosis by duplex criteria in the extracranial cerebrovascular circulation.   Signed,   Dulcy Fanny. Earleen Newport, DO, RPVI   2D echo 09/16/2020 IMPRESSIONS     1. Left ventricular ejection fraction, by estimation, is 55 to 60%. The  left ventricle has normal function. The left ventricle has no regional  wall motion abnormalities. Left ventricular diastolic parameters were  normal.   2. Right ventricular systolic function is normal. The right ventricular  size is normal. There is mildly elevated pulmonary artery systolic  pressure. The estimated right ventricular systolic pressure is Q000111Q mmHg.   3. The mitral valve is grossly normal. Mild to moderate mitral valve  regurgitation.   4. Tricuspid valve regurgitation is mild to moderate.   5. The aortic valve is tricuspid. Aortic valve regurgitation is not  visualized.   6. The inferior vena cava is normal in size with greater than 50%  respiratory variability, suggesting right atrial pressure of 3 mmHg.   FINDINGS   Left Ventricle: Left ventricular ejection fraction, by estimation, is 55  to 60%. The left ventricle has normal function. The left ventricle has no  regional wall motion abnormalities. The left ventricular internal cavity  size was normal in size. There is   no left ventricular hypertrophy. Left ventricular diastolic parameters  were normal.   Right Ventricle: The right ventricular size is normal. No increase in  right ventricular wall thickness. Right ventricular systolic function is  normal. There is mildly elevated pulmonary artery systolic pressure. The  tricuspid regurgitant velocity is 3.04   m/s, and with an assumed right atrial pressure of 3  mmHg, the estimated  right ventricular systolic pressure is Q000111Q mmHg.   Left Atrium: Left atrial size was normal in size.   Right Atrium: Right atrial size was normal in size.   Pericardium: There is no evidence of pericardial effusion.   Mitral Valve: The mitral valve is grossly normal. Mild mitral annular  calcification. Mild to moderate mitral valve regurgitation.   Tricuspid Valve: The tricuspid valve is grossly normal. Tricuspid valve  regurgitation is mild to moderate.   Aortic Valve: The aortic valve is tricuspid. There is mild to moderate  aortic valve annular calcification. Aortic valve regurgitation is not  visualized.   Pulmonic Valve: The pulmonic valve was grossly normal.  Pulmonic valve  regurgitation is trivial.   Aorta: The aortic root is normal in size and structure.   Venous: The inferior vena cava is normal in size with greater than 50%  respiratory variability, suggesting right atrial pressure of 3 mmHg.   IAS/Shunts: No atrial level shunt detected by color flow Doppler.   ZIO monitor 09/2020 ZIO XT reviewed. 13 days 17 hours analyzed. Predominant rhythm is sinus with heart rate ranging from 57 bpm up to 126 bpm and average heart rate 78 bpm. Rare PACs including couplets and triplets were noted representing less than 1% total beats. Rare PVCs were also noted representing less than 1% total beats. There were several episodes of PSVT noted, the longest of which lasted for a minute and 44 seconds with heart rate in the 120s. Most episodes were much briefer. She did have one episode of wide-complex tachycardia that likely represents aberrant SVT.      Risk Assessment/Calculations:         ASSESSMENT:    1. Retinal artery branch occlusion of left eye   2. SVT (supraventricular tachycardia) ([place])   3. Mitral valve insufficiency, unspecified etiology   4. Other hyperlipidemia      PLAN:  In order of problems listed above:  Retinal artery occlusion ZIO  monitor without A. fib, normal LVEF on echo and no significant stenosis on carotid Dopplers  PSVT and one apparent episode of SVT on ZIO monitor 09/2020 started on Toprol-XL 12.5 mg daily  Mild to moderate MR and TR on echo  Hyperlipidemia on Zocor-managed by PCP  Shared Decision Making/Informed Consent        Medication Adjustments/Labs and Tests Ordered: Current medicines are reviewed at length with the patient today.  Concerns regarding medicines are outlined above.  Medication changes, Labs and Tests ordered today are listed in the Patient Instructions below. Patient Instructions  Medication Instructions:  Your physician recommends that you continue on your current medications as directed. Please refer to the Current Medication list given to you today.  *If you need a refill on your cardiac medications before your next appointment, please call your pharmacy*   Lab Work: None today  If you have labs (blood work) drawn today and your tests are completely normal, you will receive your results only by: [place] (if you have MyChart) OR A paper copy in the mail If you have any lab test that is abnormal or we need to change your treatment, we will call you to review the results.   Testing/Procedures: None today    Follow-Up: At [facility], you and your health needs are our priority.  As part of our continuing mission to provide you with exceptional heart care, we have created designated Provider Care Teams.  These Care Teams include your primary Cardiologist (physician) and Advanced Practice Providers (APPs -  Physician Assistants and Nurse Practitioners) who all work together to provide you with the care you need, when you need it.  We recommend signing up for the patient portal called "MyChart".  Sign up information is provided on this After Visit Summary.  MyChart is used to connect with patients for Virtual Visits (Telemedicine).  Patients are able to view lab/test  results, encounter notes, upcoming appointments, etc.  Non-urgent messages can be sent to your provider as well.   To learn more about what you can do with MyChart, go to [email or web address].    Your next appointment:   12 month(s)  The format for your next appointment:  In Person  Provider:   Rozann Lesches, MD   Other Instructions None    Signed, Ermalinda Barrios, PA-C  03/07/2021 11:51 AM    [facility] [place], [place],   [address] Phone: [phone]; Fax: [phone]

## 2021-03-01 DIAGNOSIS — Z1231 Encounter for screening mammogram for malignant neoplasm of breast: Secondary | ICD-10-CM | POA: Diagnosis not present

## 2021-03-07 ENCOUNTER — Encounter: Payer: Self-pay | Admitting: Physician Assistant

## 2021-03-07 ENCOUNTER — Other Ambulatory Visit: Payer: Self-pay

## 2021-03-07 ENCOUNTER — Ambulatory Visit (INDEPENDENT_AMBULATORY_CARE_PROVIDER_SITE_OTHER): Payer: Medicare Other | Admitting: Physician Assistant

## 2021-03-07 VITALS — BP 126/62 | HR 83 | Ht 63.0 in | Wt 153.0 lb

## 2021-03-07 DIAGNOSIS — I471 Supraventricular tachycardia, unspecified: Secondary | ICD-10-CM

## 2021-03-07 DIAGNOSIS — H34232 Retinal artery branch occlusion, left eye: Secondary | ICD-10-CM | POA: Diagnosis not present

## 2021-03-07 DIAGNOSIS — I340 Nonrheumatic mitral (valve) insufficiency: Secondary | ICD-10-CM

## 2021-03-07 DIAGNOSIS — E7849 Other hyperlipidemia: Secondary | ICD-10-CM

## 2021-03-07 MED ORDER — METOPROLOL SUCCINATE ER 25 MG PO TB24: 12.5000 mg | ORAL_TABLET | Freq: Every day | ORAL | 3 refills | Status: AC

## 2021-03-07 NOTE — Patient Instructions (Signed)
Medication Instructions:   Your physician recommends that you continue on your current medications as directed. Please refer to the Current Medication list given to you today.  *If you need a refill on your cardiac medications before your next appointment, please call your pharmacy*   Lab Work: None today  If you have labs (blood work) drawn today and your tests are completely normal, you will receive your results only by: . MyChart Message (if you have MyChart) OR . A paper copy in the mail If you have any lab test that is abnormal or we need to change your treatment, we will call you to review the results.   Testing/Procedures: None today   Follow-Up: At [facility], you and your health needs are our priority.  As part of our continuing mission to provide you with exceptional heart care, we have created designated Provider Care Teams.  These Care Teams include your primary Cardiologist (physician) and Advanced Practice Providers (APPs -  Physician Assistants and Nurse Practitioners) who all work together to provide you with the care you need, when you need it.  We recommend signing up for the patient portal called "MyChart".  Sign up information is provided on this After Visit Summary.  MyChart is used to connect with patients for Virtual Visits (Telemedicine).  Patients are able to view lab/test results, encounter notes, upcoming appointments, etc.  Non-urgent messages can be sent to your provider as well.   To learn more about what you can do with MyChart, go to [email or web address].    Your next appointment:   12 month(s)  The format for your next appointment:   In Person  Provider:   Samuel McDowell, MD   Other Instructions None   

## 2021-03-17 DIAGNOSIS — M5416 Radiculopathy, lumbar region: Secondary | ICD-10-CM | POA: Diagnosis not present

## 2021-05-19 DIAGNOSIS — U071 COVID-19: Secondary | ICD-10-CM | POA: Diagnosis not present

## 2021-05-19 DIAGNOSIS — Z23 Encounter for immunization: Secondary | ICD-10-CM | POA: Diagnosis not present

## 2021-07-07 DIAGNOSIS — H35363 Drusen (degenerative) of macula, bilateral: Secondary | ICD-10-CM | POA: Diagnosis not present

## 2021-10-12 DIAGNOSIS — E7800 Pure hypercholesterolemia, unspecified: Secondary | ICD-10-CM | POA: Diagnosis not present

## 2021-10-12 DIAGNOSIS — M810 Age-related osteoporosis without current pathological fracture: Secondary | ICD-10-CM | POA: Diagnosis not present

## 2021-10-12 DIAGNOSIS — I471 Supraventricular tachycardia: Secondary | ICD-10-CM | POA: Diagnosis not present

## 2021-10-12 DIAGNOSIS — M545 Low back pain, unspecified: Secondary | ICD-10-CM | POA: Diagnosis not present

## 2021-10-12 DIAGNOSIS — G479 Sleep disorder, unspecified: Secondary | ICD-10-CM | POA: Diagnosis not present

## 2021-10-12 DIAGNOSIS — M47816 Spondylosis without myelopathy or radiculopathy, lumbar region: Secondary | ICD-10-CM | POA: Diagnosis not present

## 2021-10-12 DIAGNOSIS — Z0000 Encounter for general adult medical examination without abnormal findings: Secondary | ICD-10-CM | POA: Diagnosis not present

## 2021-10-12 DIAGNOSIS — H348392 Tributary (branch) retinal vein occlusion, unspecified eye, stable: Secondary | ICD-10-CM | POA: Diagnosis not present

## 2022-03-13 DIAGNOSIS — Z1231 Encounter for screening mammogram for malignant neoplasm of breast: Secondary | ICD-10-CM | POA: Diagnosis not present

## 2022-07-07 DIAGNOSIS — H35363 Drusen (degenerative) of macula, bilateral: Secondary | ICD-10-CM | POA: Diagnosis not present

## 2022-07-12 NOTE — Progress Notes (Unsigned)
Cardiology Office Note  Date: 07/13/2022   ID: Natasha Madden, Natasha Madden [birth year], MRN [id number]  PCP:  Kathyrn Lass, MD  Cardiologist:  Rozann Lesches, MD Electrophysiologist:  None   Chief Complaint  Patient presents with   Cardiac follow-up    History of Present Illness: Natasha Madden is an 81 y.o. female last seen in August 2022 by Ms. Bonnell Public PA-C.  She is here for a routine visit.  Reports no major interval symptoms in terms of visual changes, palpitations, or chest pain.  Remains functional with ADLs.  I reviewed her medications which are noted below.  She follows with PCP at [facility].  Requesting her last lipid panel for review.  She has tolerated Zocor, did not tolerate Lipitor prior to that.  I personally reviewed her ECG today which shows normal sinus rhythm.  Carotid Dopplers in February 2022 showed moderate atherosclerotic plaque without reported hemodynamic significant stenosis.  We discussed getting a follow-up study.  Past Medical History:  Diagnosis Date   Atypical pneumonia    Colon polyp    Degenerative disc disease, cervical    Diverticulosis    Hyperlipidemia    Insomnia    Osteoporosis     Past Surgical History:  Procedure Laterality Date   ABDOMINAL HYSTERECTOMY  1970's   FOOT SURGERY     Skin cancer resection  06/16/13   lower lip    Current Outpatient Medications  Medication Sig Dispense Refill   aspirin EC 81 MG tablet Take 81 mg by mouth daily. Swallow whole.     metoprolol succinate (TOPROL XL) 25 MG 24 hr tablet Take 0.5 tablets (12.5 mg total) by mouth daily. 45 tablet 3   Multiple Vitamin (MULTIVITAMIN) tablet Take 1 tablet by mouth daily.     Multiple Vitamins-Minerals (PRESERVISION AREDS 2) CAPS      simvastatin (ZOCOR) 40 MG tablet Take 40 mg by mouth daily.     traMADol (ULTRAM) 50 MG tablet Take by mouth every 6 (six) hours as needed.     zolpidem (AMBIEN) 5 MG tablet Take 5 mg by mouth at bedtime as needed.     No current  facility-administered medications for this visit.   Allergies:  Fosamax [alendronate] and Lipitor [atorvastatin]   ROS: No syncope.  Physical Exam: VS:  BP 126/72   Pulse 70   Wt 145 lb (65.8 kg)   SpO2 98%   BMI 25.69 kg/m , BMI Body mass index is 25.69 kg/m.  Wt Readings from Last 3 Encounters:  07/13/22 145 lb (65.8 kg)  03/07/21 153 lb (69.4 kg)  09/07/20 156 lb (70.8 kg)    General: Patient appears comfortable at rest. HEENT: Conjunctiva and lids normal. Neck: Supple, no elevated JVP or carotid bruits. Lungs: Clear to auscultation, nonlabored breathing at rest. Cardiac: Regular rate and rhythm, no S3, 1/6 systolic murmur. Extremities: No pitting edema.  ECG:  An ECG dated 09/07/2020 was personally reviewed today and demonstrated:  Sinus rhythm.  Recent Labwork:  No interval lab work for review today.  Other Studies Reviewed Today:  Echocardiogram 09/16/2020:  1. Left ventricular ejection fraction, by estimation, is 55 to 60%. The  left ventricle has normal function. The left ventricle has no regional  wall motion abnormalities. Left ventricular diastolic parameters were  normal.   2. Right ventricular systolic function is normal. The right ventricular  size is normal. There is mildly elevated pulmonary artery systolic  pressure. The estimated right ventricular systolic pressure is 43.3 mmHg.  3. The mitral valve is grossly normal. Mild to moderate mitral valve  regurgitation.   4. Tricuspid valve regurgitation is mild to moderate.   5. The aortic valve is tricuspid. Aortic valve regurgitation is not  visualized.   6. The inferior vena cava is normal in size with greater than 50%  respiratory variability, suggesting right atrial pressure of 3 mmHg.   Carotid Dopplers 09/16/2020: IMPRESSION: Color duplex indicates moderate heterogeneous plaque with no hemodynamically significant stenosis by duplex criteria in the extracranial cerebrovascular  circulation.  Cardiac monitor March 2022: ZIO XT reviewed. 13 days 17 hours analyzed. Predominant rhythm is sinus with heart rate ranging from 57 bpm up to 126 bpm and average heart rate 78 bpm. Rare PACs including couplets and triplets were noted representing less than 1% total beats. Rare PVCs were also noted representing less than 1% total beats. There were several episodes of PSVT noted, the longest of which lasted for a minute and 44 seconds with heart rate in the 120s. Most episodes were much briefer. She did have one episode of wide-complex tachycardia that likely represents aberrant SVT.   Assessment and Plan:  1.  History of retinal artery branch occlusion on the left.  No atrial fibrillation documented by cardiac monitor in 2022.  She did have nonobstructive carotid artery atherosclerosis which will be reassessed by follow-up carotid Dopplers.  She continues on aspirin and Zocor.  Requesting interval lipids to assess LDL control.  2.  Mixed hyperlipidemia on Zocor.  Ideally LDL should be 50-60 range with atherosclerosis.  Medication Adjustments/Labs and Tests Ordered: Current medicines are reviewed at length with the patient today.  Concerns regarding medicines are outlined above.   Tests Ordered: Orders Placed This Encounter  Procedures   EKG 12-Lead   VAS US CAROTID    Medication Changes: No orders of the defined types were placed in this encounter.   Disposition:  Follow up  1 year.  Signed, Satira Sark, MD, [facility] 07/13/2022 8:49 AM    [facility]. [address] [place], [place] [address] Phone: [phone]; Fax: [phone]

## 2022-07-13 ENCOUNTER — Ambulatory Visit: Payer: Medicare Other | Attending: Cardiology | Admitting: Cardiology

## 2022-07-13 ENCOUNTER — Encounter: Payer: Self-pay | Admitting: Cardiology

## 2022-07-13 VITALS — BP 126/72 | HR 70 | Wt 145.0 lb

## 2022-07-13 DIAGNOSIS — I471 Supraventricular tachycardia, unspecified: Secondary | ICD-10-CM

## 2022-07-13 DIAGNOSIS — I6523 Occlusion and stenosis of bilateral carotid arteries: Secondary | ICD-10-CM

## 2022-07-13 DIAGNOSIS — E782 Mixed hyperlipidemia: Secondary | ICD-10-CM

## 2022-07-13 NOTE — Addendum Note (Signed)
Addended by: Barbarann Ehlers A on: 07/13/2022 08:54 AM   Modules accepted: Orders

## 2022-07-13 NOTE — Patient Instructions (Signed)
Medication Instructions:Your physician recommends that you continue on your current medications as directed. Please refer to the Current Medication list given to you today.    Labwork: None today  Testing/Procedures:Your physician has requested that you have a carotid duplex. This test is an ultrasound of the carotid arteries in your neck. It looks at blood flow through these arteries that supply the brain with blood. Allow one hour for this exam. There are no restrictions or special instructions.   Follow-Up: 1 year  Any Other Special Instructions Will Be Listed Below (If Applicable).  If you need a refill on your cardiac medications before your next appointment, please call your pharmacy.

## 2022-07-21 ENCOUNTER — Ambulatory Visit (HOSPITAL_COMMUNITY)
Admission: RE | Admit: 2022-07-21 | Discharge: 2022-07-21 | Disposition: A | Discharge status: Home / Self Care | Payer: Medicare Other | Source: Ambulatory Visit | Attending: Cardiology | Admitting: Cardiology

## 2022-07-21 DIAGNOSIS — I6523 Occlusion and stenosis of bilateral carotid arteries: Secondary | ICD-10-CM | POA: Insufficient documentation

## 2022-08-28 IMAGING — US US CAROTID DUPLEX BILAT
1 series · 13 of 24 positions shown · non-contrast
Comparison: None.

CLINICAL DATA: 80-year-old female with retinal artery occlusion

EXAM:
BILATERAL CAROTID DUPLEX ULTRASOUND
TECHNIQUE: Gray scale imaging, color Doppler and duplex ultrasound were
performed of bilateral carotid and vertebral arteries in the neck.

[Series 1: us carotid bilateral · 13 of 70 slices shown]
[im 1/70]
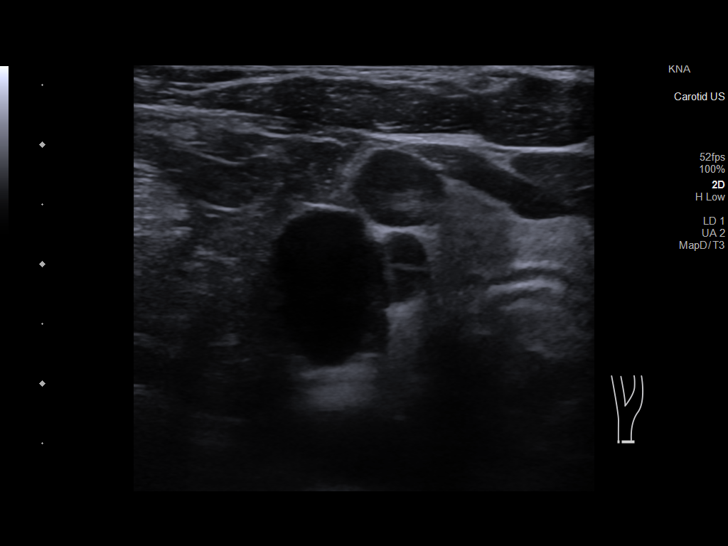
[im 7/70]
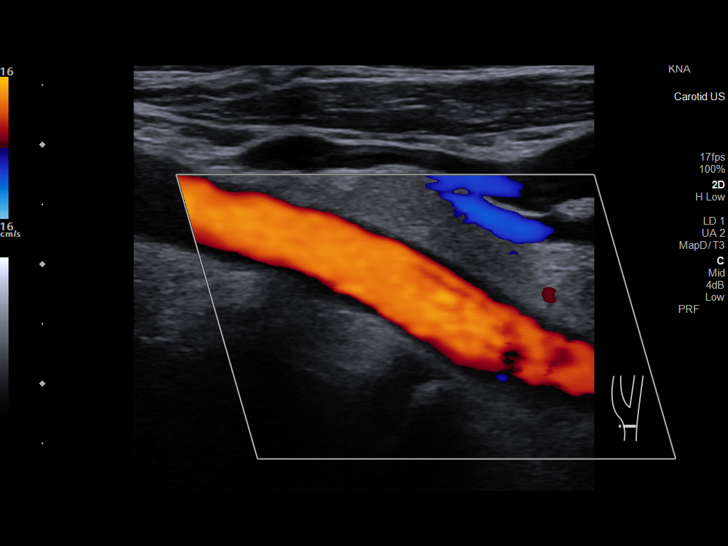
[im 13/70]
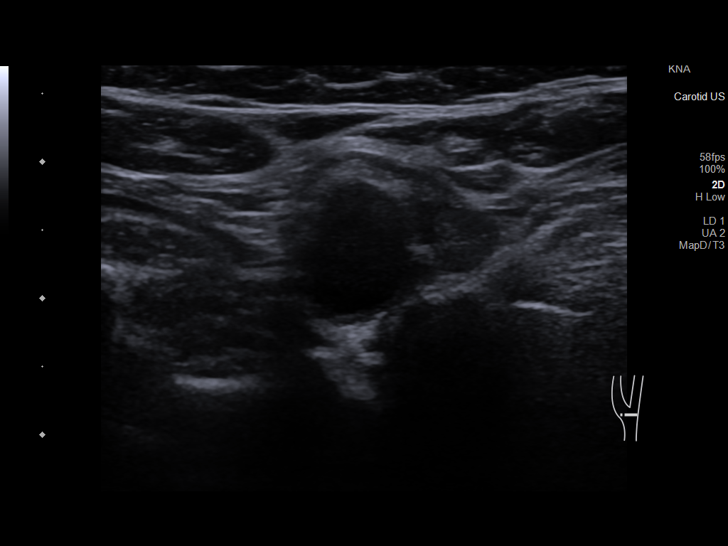
[im 19/70]
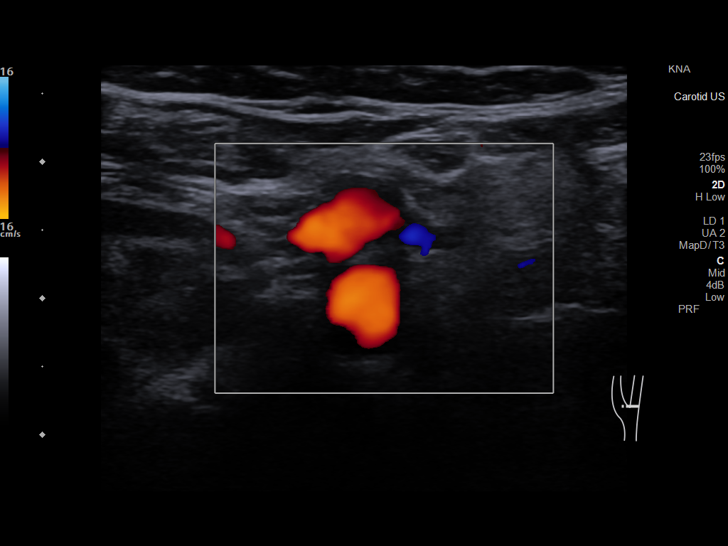
[im 25/70]
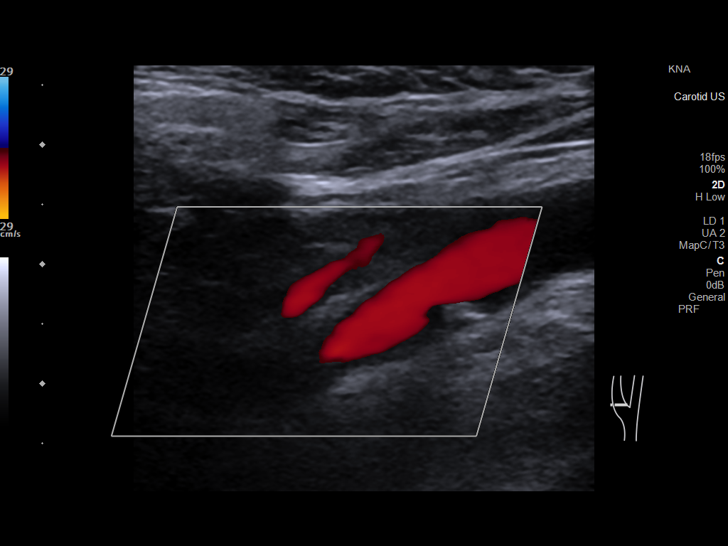
[im 31/70]
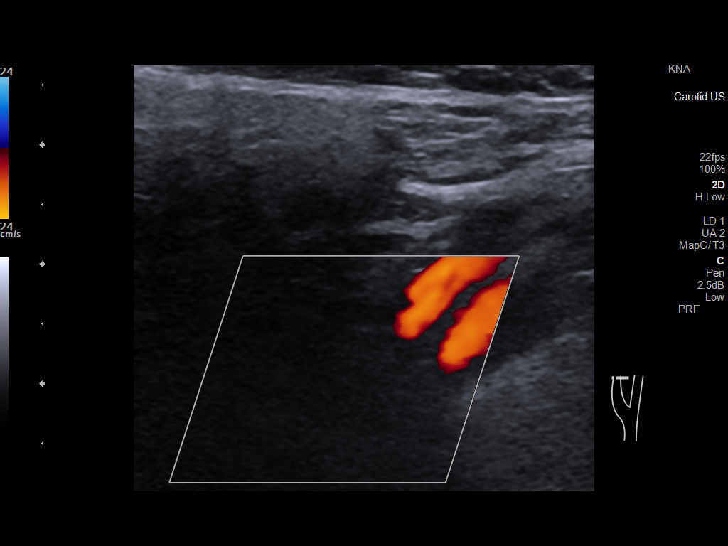
[im 37/70]
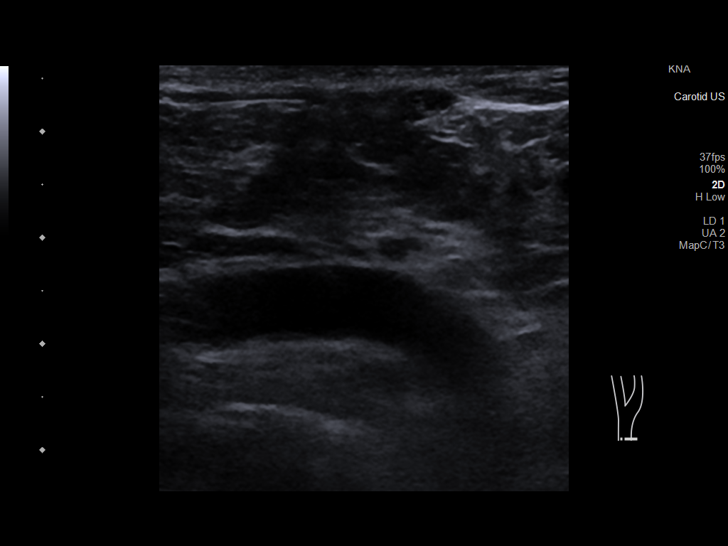
[im 40/70]
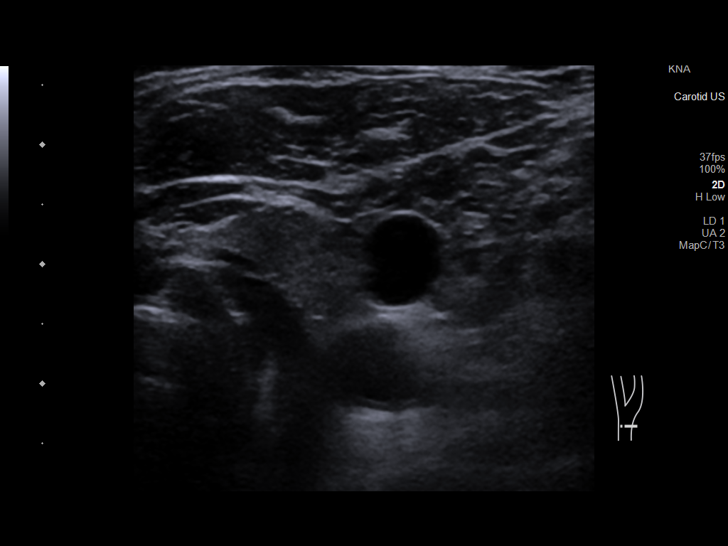
[im 46/70]
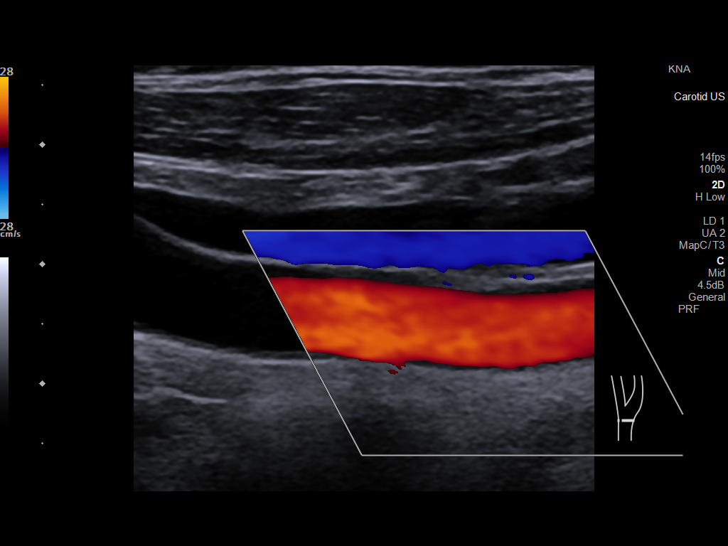
[im 52/70]
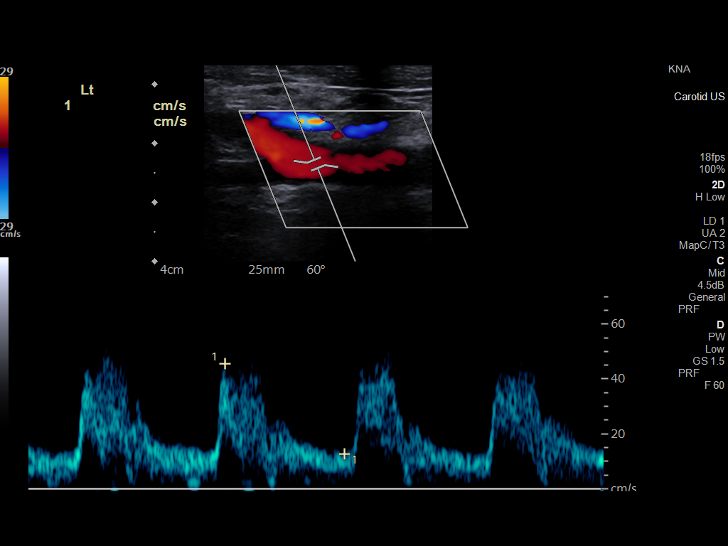
[im 58/70]
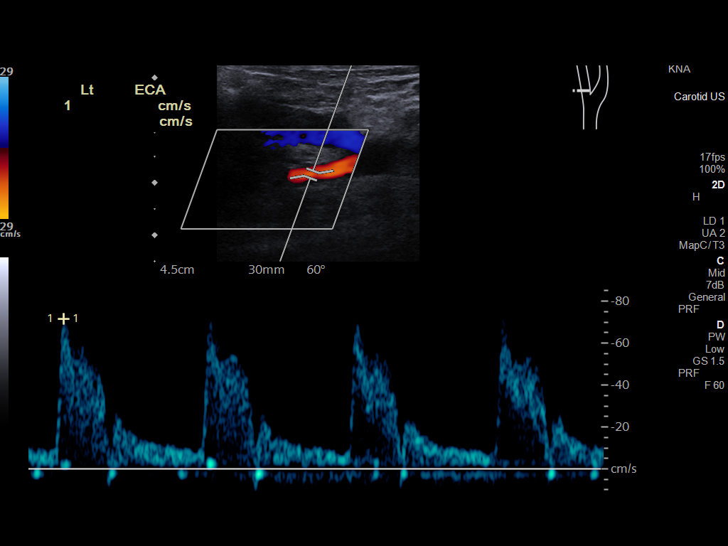
[im 64/70]
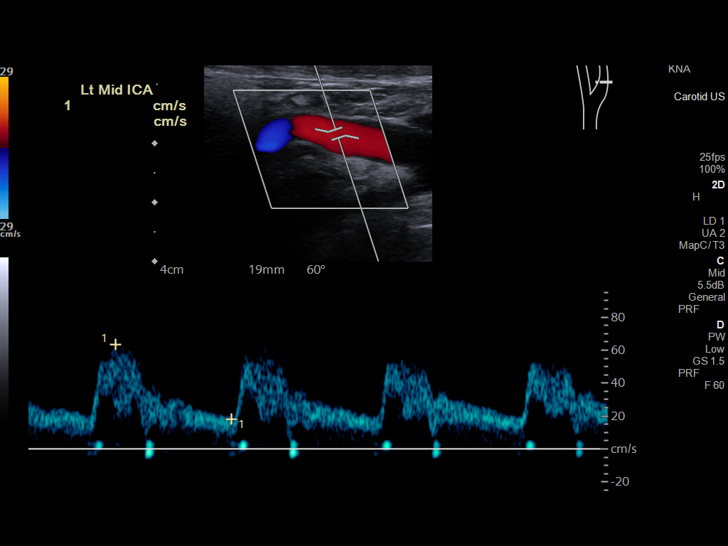
[im 70/70]
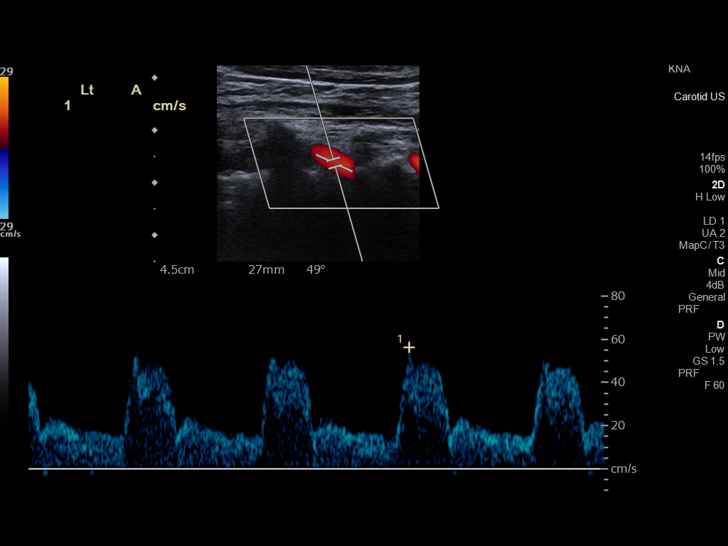

[13 of 24 positions shown; findings below may reference images not displayed]

FINDINGS: Criteria: Quantification of carotid stenosis is based on velocity
parameters that correlate the residual internal carotid diameter
with NASCET-based stenosis levels, using the diameter of the distal
internal carotid lumen as the denominator for stenosis measurement.

The following velocity measurements were obtained:

RIGHT

ICA:  Systolic 54 cm/sec, Diastolic 17 cm/sec

CCA:  94 cm/sec

SYSTOLIC ICA/CCA RATIO:

ECA:  93 cm/sec

LEFT

ICA:  Systolic 76 cm/sec, Diastolic 22 cm/sec

CCA:  63 cm/sec

SYSTOLIC ICA/CCA RATIO:

ECA:  72 cm/sec

Right Brachial SBP: Not acquired

Left Brachial SBP: Not acquired

RIGHT CAROTID ARTERY: No significant calcified disease of the right
common carotid artery. Intermediate waveform maintained.
Heterogeneous plaque without significant calcifications at the right
carotid bifurcation. Low resistance waveform of the right ICA. No
significant tortuosity.

RIGHT VERTEBRAL ARTERY: Antegrade flow with low resistance waveform.

LEFT CAROTID ARTERY: No significant calcified disease of the left
common carotid artery. Intermediate waveform maintained.
Heterogeneous plaque at the left carotid bifurcation without
significant calcifications. Low resistance waveform of the left ICA.

LEFT VERTEBRAL ARTERY:  Antegrade flow with low resistance waveform.
IMPRESSION: Color duplex indicates moderate heterogeneous plaque with no
hemodynamically significant stenosis by duplex criteria in the
extracranial cerebrovascular circulation.

## 2022-09-20 ENCOUNTER — Telehealth: Payer: Self-pay | Admitting: Cardiology

## 2022-09-20 NOTE — Telephone Encounter (Signed)
Pt came in office w/new insurance cards. She stated the new insurance does not cover [employer] so she will be using [facility] [address]. She thinks this has already been changed with her account but not sure.

## 2022-09-20 NOTE — Telephone Encounter (Signed)
Change completed.

## 2022-09-21 ENCOUNTER — Encounter: Payer: Self-pay | Admitting: Radiology

## 2023-07-30 ENCOUNTER — Ambulatory Visit: Payer: 59 | Admitting: Cardiology

## 2023-09-06 ENCOUNTER — Ambulatory Visit: Payer: 59 | Admitting: Nurse Practitioner

## 2023-11-15 ENCOUNTER — Ambulatory Visit: Payer: 59 | Attending: Cardiology | Admitting: Cardiology

## 2023-11-15 ENCOUNTER — Encounter: Payer: Self-pay | Admitting: Cardiology

## 2023-11-15 VITALS — BP 138/74 | HR 74 | Ht 63.0 in | Wt 137.2 lb

## 2023-11-15 DIAGNOSIS — H34232 Retinal artery branch occlusion, left eye: Secondary | ICD-10-CM | POA: Diagnosis not present

## 2023-11-15 DIAGNOSIS — E782 Mixed hyperlipidemia: Secondary | ICD-10-CM

## 2023-11-15 DIAGNOSIS — I471 Supraventricular tachycardia, unspecified: Secondary | ICD-10-CM

## 2023-11-15 MED ORDER — ROSUVASTATIN CALCIUM 20 MG PO TABS: 20.0000 mg | ORAL_TABLET | Freq: Every day | ORAL | 3 refills | Status: DC

## 2023-11-15 NOTE — Patient Instructions (Addendum)
 Medication Instructions:  Your physician has recommended you make the following change in your medication:  Stop simvastatin Start rosuvastatin  20 mg daily Continue all other medications as prescribed  Labwork: Your physician recommends that you return for a FASTING lipid profile in 4 months (August 2025). Please do not eat or drink for at least 8 hours when you have this done. You may take your medications that morning with a sip of water. [facility] ([address] or [address] [place])  Testing/Procedures: none  Follow-Up: Your physician recommends that you schedule a follow-up appointment in: 1 year. You will receive a reminder call in about 8-10 months reminding you to schedule your appointment. If you don't receive this call, please contact our office.  Any Other Special Instructions Will Be Listed Below (If Applicable).  If you need a refill on your cardiac medications before your next appointment, please call your pharmacy.

## 2023-11-15 NOTE — Progress Notes (Signed)
    Cardiology Office Note  Date: 11/15/2023   ID: Zamora, Colton 02-04-1941, MRN [id number]  History of Present Illness: Natasha Madden is an 83 y.o. female last seen in December 2023.  She is here for a routine visit.  Reports no interval visual changes, no exertional chest pain, no palpitations or syncope.  Went over her medications.  She has continued on Zocor 40 mg daily.  Recent LDL 100 with HDL 53.  I reviewed her ECG today which shows normal sinus rhythm.  Physical Exam: VS:  BP 138/74   Pulse 74   Ht 5\' 3"  (1.6 m)   Wt 137 lb 3.2 oz (62.2 kg)   SpO2 97%   BMI 24.30 kg/m , BMI Body mass index is 24.3 kg/m.  Wt Readings from Last 3 Encounters:  11/15/23 137 lb 3.2 oz (62.2 kg)  07/13/22 145 lb (65.8 kg)  03/07/21 153 lb (69.4 kg)    General: Patient appears comfortable at rest. HEENT: Conjunctiva and lids normal. Neck: Supple, no elevated JVP or carotid bruits. Lungs: Clear to auscultation, nonlabored breathing at rest. Cardiac: Regular rate and rhythm, no S3, 1/6 systolic murmur. Extremities: No pitting edema.  ECG:  An ECG dated 07/13/2022 was personally reviewed today and demonstrated:  Sinus rhythm.  Labwork:  March 2025: Cholesterol 171, triglycerides 99, HDL 53, LDL 100, BUN 12, creatinine 0.70, potassium 4.6, GFR 85, AST 20, ALT 10  Other Studies Reviewed Today:  No interval cardiac testing for review today.  Assessment and Plan:  1.  History of retinal artery branch occlusion on the left.  No atrial fibrillation documented by cardiac monitor in 2022.  Carotid Dopplers in December 2023 demonstrated no significant ICA stenosis and patent vertebral arteries (previously described nonobstructive atherosclerotic plaque as of 2022).  Would continue observation and general risk factor reduction.   2.  Mixed hyperlipidemia.  Recent lab work showed LDL 100 and HDL 53.  Suggest switching from Zocor to Crestor  20 mg daily, recheck FLP in 4 months.  Would like to  get LDL closer to 70 at this point.  Disposition:  Follow up  1 year.  Signed, Gerard Knight, M.D., F.A.C.C. [facility]

## 2023-12-26 ENCOUNTER — Telehealth: Payer: Self-pay | Admitting: Cardiology

## 2023-12-26 DIAGNOSIS — I6523 Occlusion and stenosis of bilateral carotid arteries: Secondary | ICD-10-CM

## 2023-12-26 DIAGNOSIS — H538 Other visual disturbances: Secondary | ICD-10-CM

## 2023-12-26 NOTE — Telephone Encounter (Signed)
 Reports on this past Sunday morning she had blurred vision in right eye. Reports she saw her eye doctor on Monday, 12/24/2023 and there were no findings as the cause of her right eye blurred vision. Reports her right eye blurred vision has improved but she is unable to see as well as she did prior to Sunday. Reports she brought records to office this morning from Equilla Hastings (ophthalmologist) visit. Records placed on providers desk for review.  Reports she is still driving and feels fine. Denies chest pain, SOB or dizziness. Reports she feels she may be having mini-strokes and wanted Dr. Londa Rival to be aware and make recommendations. Advised that she should contact her PCP with these symptoms and message would be sent to provider for review.  Advised that FLP is due in August 2025 per recent office visit and statin change to rosuvastatin . Advised that this can be done at [facility] in [facility]. Or [address] Verbalized understanding.

## 2023-12-26 NOTE — Telephone Encounter (Signed)
 Pt came into [place] office and wanted to speak with Dr.Mcdowell. She had a few questions she would like answered.  Do I need another blood check? Or MRI that would show a possible Mini Stroke?  2.   Do I need to change any of my medicines?

## 2023-12-26 NOTE — Telephone Encounter (Signed)
 Patient informed and verbalized understanding of plan.

## 2023-12-31 ENCOUNTER — Ambulatory Visit: Attending: Cardiology

## 2023-12-31 DIAGNOSIS — H538 Other visual disturbances: Secondary | ICD-10-CM

## 2023-12-31 DIAGNOSIS — I6523 Occlusion and stenosis of bilateral carotid arteries: Secondary | ICD-10-CM | POA: Diagnosis not present

## 2024-01-01 ENCOUNTER — Ambulatory Visit: Payer: Self-pay | Admitting: Cardiology

## 2024-01-02 ENCOUNTER — Other Ambulatory Visit: Payer: Self-pay | Admitting: Family Medicine

## 2024-01-02 DIAGNOSIS — H538 Other visual disturbances: Secondary | ICD-10-CM

## 2024-01-16 ENCOUNTER — Encounter

## 2024-02-03 ENCOUNTER — Ambulatory Visit
Admission: RE | Admit: 2024-02-03 | Discharge: 2024-02-03 | Disposition: A | Discharge status: Home / Self Care | Source: Ambulatory Visit | Attending: Family Medicine | Admitting: Family Medicine

## 2024-02-03 DIAGNOSIS — H538 Other visual disturbances: Secondary | ICD-10-CM

## 2024-02-03 MED ORDER — GADOPICLENOL 0.5 MMOL/ML IV SOLN
7.5000 mL | Freq: Once | INTRAVENOUS | Status: AC | PRN
  Administered 2024-02-03: 7.5 mL via INTRAVENOUS

## 2024-04-11 ENCOUNTER — Other Ambulatory Visit: Payer: Self-pay

## 2024-04-11 ENCOUNTER — Ambulatory Visit: Payer: Self-pay | Admitting: Cardiology

## 2024-04-11 LAB — LIPID PANEL
Chol/HDL Ratio: 2.9 ratio (ref 0.0–4.4)
Cholesterol, Total: 165 mg/dL (ref 100–199)
HDL: 57 mg/dL (ref >39)
LDL Chol Calc (NIH): 92 mg/dL (ref 0–99)
Triglycerides: 87 mg/dL (ref 0–149)
VLDL Cholesterol Cal: 16 mg/dL (ref 5–40)

## 2024-04-11 MED ORDER — ROSUVASTATIN CALCIUM 40 MG PO TABS: 40.0000 mg | ORAL_TABLET | Freq: Every day | ORAL | 3 refills | Status: AC

## 2024-04-11 NOTE — Telephone Encounter (Signed)
 PT returning call regarding lab results. Please advise
